# Patient Record
Sex: Male | Born: 1985
Health system: Southern US, Community
[De-identification: ages and names within clinical notes are randomized; demographics above are authoritative.]

## PROBLEM LIST (undated history)

## (undated) DIAGNOSIS — F32A Depression, unspecified: Secondary | ICD-10-CM

## (undated) DIAGNOSIS — I456 Pre-excitation syndrome: Secondary | ICD-10-CM

## (undated) DIAGNOSIS — F419 Anxiety disorder, unspecified: Secondary | ICD-10-CM

## (undated) DIAGNOSIS — F329 Major depressive disorder, single episode, unspecified: Secondary | ICD-10-CM

## (undated) HISTORY — DX: Anxiety disorder, unspecified: F41.9

## (undated) HISTORY — DX: Major depressive disorder, single episode, unspecified: F32.9

## (undated) HISTORY — DX: Depression, unspecified: F32.A

## (undated) HISTORY — DX: Pre-excitation syndrome: I45.6

---

## 2008-08-03 ENCOUNTER — Emergency Department (HOSPITAL_COMMUNITY): Admission: EM | Admit: 2008-08-03 | Discharge: 2008-08-03 | Payer: Self-pay | Admitting: Family Medicine

## 2011-11-08 ENCOUNTER — Ambulatory Visit: Payer: Self-pay | Admitting: Family Medicine

## 2011-11-08 VITALS — BP 130/88 | HR 88 | Temp 98.5°F | Resp 16 | Ht 76.5 in | Wt 212.4 lb

## 2011-11-08 DIAGNOSIS — F429 Obsessive-compulsive disorder, unspecified: Secondary | ICD-10-CM

## 2011-11-08 DIAGNOSIS — F341 Dysthymic disorder: Secondary | ICD-10-CM

## 2011-11-08 MED ORDER — FLUOXETINE HCL 40 MG PO CAPS: 40.0000 mg | ORAL_CAPSULE | Freq: Every day | ORAL | Status: DC

## 2011-11-08 NOTE — Progress Notes (Signed)
26 yo with OCD and depression several days a week. He is not depressed every day of every week.  No suicidal ideation. Did well on prozac 40 qd before O:  Alert, pleasant. A:  OCD, dysthymia  P:  Prozac 40 mg qd x 1 year

## 2011-11-08 NOTE — Patient Instructions (Signed)
Obsessive-Compulsive Disorder Obsessive-compulsive behavior is an anxiety disorder. The patient is constantly troubled by ideas that stay in the mind that cannot be ignored (obsessions). These troubling and sometimes bizarre thoughts compel one to behave in an unreasonable way. The patient will carry out repetitive, ritualistic acts (compulsions) to reduce anxiety. CAUSES The cause of obsessive-compulsive disorder (OCD) is not known. Scientists do know it runs in families. Men begin experiencing it in the teenage years. Women usually begin getting problems (symptoms) in their early 20's. Some studies have shown that the functioning of parts of the brain are different in those with OCD. The disorder may be closely associated with depression. SYMPTOMS  Persons with OCD recognize that their obsessions or compulsions prevent them from living a normal life. They commonly describe their behavior as foolish or pointless. But they cannot change it. Persons with OCD usually feel that their thoughts or obsessions are strange. They do not understand why they are having them. Sometimes the thoughts have to do with a fear that something terrible will happen or that they will do something terrible. Persons with OCD may spend hours each day performing senseless compulsive acts. The amount of time spent is less important than the degree of disruption caused in everyday life. EXAMPLES OF TYPICAL RITUALS  Cleaning. Fearing germs, a person may shower repeatedly throughout the entire day or wash his or her hands until they bleed.   Repeating. To dispel anxiety, a person may repeat a name or phrase many times.   Completing. A person may perform a series of complicated steps in an exact order or repeat them until they are done perfectly.   Checking. A person may check things over and over to make sure a task has been completed. For example, repeatedly checking to see if the door is locked or the toaster is unplugged.    Hoarding. A person may constantly collect useless items that he or she repeatedly counts and stacks.   People with OCD may have emotional symptoms associated with depression. This may include guilt, low self-esteem, anxiety, and extreme fatigue. Many of these emotional problems are a result of the frustration brought on by an obsessive-compulsive problem.   Obsessive-compulsive symptoms will often create problems in relationships.   In extreme cases, people with OCD become totally disabled, have no friends, and are unable to leave home surroundings. They spend the day performing rituals or having obsessive thoughts.  DIAGNOSIS  There is no laboratory test for OCD. It is diagnosed by your caregiver talking with you and someone close to you about your symptoms. Your caregiver will ask very specific questions about the type of obsessions or compulsions you have.   Your caregiver may diagnose obsessive-compulsive disorder if your obsessions or compulsions:   Cause you distress.   Take more than an hour of your time per day.   Interfere with your normal routine, occupation, social activities, or relationships with others.  YOUR CAREGIVER MAY ASK YOU SUCH QUESTIONS AS:  Do you have troubling thoughts that you cannot dispel regardless of how hard you try?   Do you keep things extremely clean and wash your hands more than other people you know?   Do you check things over and over, even though you know that the oven has been turned off or that the front door is locked?  TREATMENT  A combination of antidepressant or anti-anxiety drugs and behavior therapy has been most helpful in treating the disorder. Clomipramine (Anafranil) is often used in the   treatment of OCD. Some drugs like Zoloft and Luvox have been used with positive results. In very rare cases, neurosurgery is performed. OCD is not obsession about life's normal worries. Your caregiver will have to make sure that a medication or drug is  not adding to your symptoms. Phobias and longstanding (chronic) depression can also occur along with OCD.  HOW LONG WILL THE EFFECTS OF OBSESSIVE-COMPULSIVE DISORDER LAST? Obsessive-compulsive disorder usually appears in the late teens and early twenties. The disorder may last a lifetime without treatment. It may become less severe from time to time, but never quite goes away. You may become free of your symptoms for years before having a relapse. Developments in behavior therapy and new medications are helping many people with OCD live productive lives. HOME CARE INSTRUCTIONS   Include your family in your therapy. You and your family may benefit from reading books, studying OCD, and attending support groups.   Take your medication as ordered by your caregiver.   Do not miss your behavioral therapy sessions.   Know that you are not alone. There are millions of people affected by OCD. There are national organizations devoted to helping people with this disorder.  SEEK IMMEDIATE MEDICAL CARE IF:  You feel that any of your ideas or actions are slipping out of your control. Document Released: 08/02/2001 Document Revised: 07/28/2011 Document Reviewed: 04/15/2008 [facility] Patient Information 2012 [place], [place].

## 2012-12-20 ENCOUNTER — Other Ambulatory Visit: Payer: Self-pay | Admitting: Family Medicine

## 2013-01-31 ENCOUNTER — Ambulatory Visit: Payer: Self-pay | Admitting: Family Medicine

## 2013-03-08 ENCOUNTER — Ambulatory Visit: Payer: Self-pay | Admitting: Emergency Medicine

## 2013-03-08 VITALS — BP 102/68 | HR 52 | Temp 97.4°F | Resp 18 | Ht 76.0 in | Wt 215.0 lb

## 2013-03-08 DIAGNOSIS — F341 Dysthymic disorder: Secondary | ICD-10-CM

## 2013-03-08 DIAGNOSIS — F429 Obsessive-compulsive disorder, unspecified: Secondary | ICD-10-CM

## 2013-03-08 MED ORDER — FLUOXETINE HCL 40 MG PO CAPS: 40.0000 mg | ORAL_CAPSULE | Freq: Every day | ORAL | Status: DC

## 2013-03-08 NOTE — Patient Instructions (Addendum)
Obsessive-Compulsive Disorder Obsessive-compulsive behavior is an anxiety disorder. The patient is constantly troubled by ideas that stay in the mind that cannot be ignored (obsessions). These troubling and sometimes bizarre thoughts compel one to behave in an unreasonable way. The patient will carry out repetitive, ritualistic acts (compulsions) to reduce anxiety. CAUSES The cause of obsessive-compulsive disorder (OCD) is not known. Scientists do know it runs in families. Men begin experiencing it in the teenage years. Women usually begin getting problems (symptoms) in their early 20's. Some studies have shown that the functioning of parts of the brain are different in those with OCD. The disorder may be closely associated with depression. SYMPTOMS  Persons with OCD recognize that their obsessions or compulsions prevent them from living a normal life. They commonly describe their behavior as foolish or pointless. But they cannot change it. Persons with OCD usually feel that their thoughts or obsessions are strange. They do not understand why they are having them. Sometimes the thoughts have to do with a fear that something terrible will happen or that they will do something terrible. Persons with OCD may spend hours each day performing senseless compulsive acts. The amount of time spent is less important than the degree of disruption caused in everyday life. EXAMPLES OF TYPICAL RITUALS  Cleaning. Fearing germs, a person may shower repeatedly throughout the entire day or wash his or her hands until they bleed.  Repeating. To dispel anxiety, a person may repeat a name or phrase many times.  Completing. A person may perform a series of complicated steps in an exact order or repeat them until they are done perfectly.  Checking. A person may check things over and over to make sure a task has been completed. For example, repeatedly checking to see if the door is locked or the toaster is  unplugged.  Hoarding. A person may constantly collect useless items that he or she repeatedly counts and stacks.  People with OCD may have emotional symptoms associated with depression. This may include guilt, low self-esteem, anxiety, and extreme fatigue. Many of these emotional problems are a result of the frustration brought on by an obsessive-compulsive problem.  Obsessive-compulsive symptoms will often create problems in relationships.  In extreme cases, people with OCD become totally disabled, have no friends, and are unable to leave home surroundings. They spend the day performing rituals or having obsessive thoughts. DIAGNOSIS  There is no laboratory test for OCD. It is diagnosed by your caregiver talking with you and someone close to you about your symptoms. Your caregiver will ask very specific questions about the type of obsessions or compulsions you have.   Your caregiver may diagnose obsessive-compulsive disorder if your obsessions or compulsions:  Cause you distress.  Take more than an hour of your time per day.  Interfere with your normal routine, occupation, social activities, or relationships with others. YOUR CAREGIVER MAY ASK YOU SUCH QUESTIONS AS:  Do you have troubling thoughts that you cannot dispel regardless of how hard you try?  Do you keep things extremely clean and wash your hands more than other people you know?  Do you check things over and over, even though you know that the oven has been turned off or that the front door is locked? TREATMENT  A combination of antidepressant or anti-anxiety drugs and behavior therapy has been most helpful in treating the disorder. Clomipramine (Anafranil) is often used in the treatment of OCD. Some drugs like Zoloft and Luvox have been used with positive results.  In very rare cases, neurosurgery is performed. OCD is not obsession about life's normal worries. Your caregiver will have to make sure that a medication or drug is  not adding to your symptoms. Phobias and longstanding (chronic) depression can also occur along with OCD.  HOW LONG WILL THE EFFECTS OF OBSESSIVE-COMPULSIVE DISORDER LAST? Obsessive-compulsive disorder usually appears in the late teens and early twenties. The disorder may last a lifetime without treatment. It may become less severe from time to time, but never quite goes away. You may become free of your symptoms for years before having a relapse. Developments in behavior therapy and new medications are helping many people with OCD live productive lives. HOME CARE INSTRUCTIONS   Include your family in your therapy. You and your family may benefit from reading books, studying OCD, and attending support groups.  Take your medication as ordered by your caregiver.  Do not miss your behavioral therapy sessions.  Know that you are not alone. There are millions of people affected by OCD. There are national organizations devoted to helping people with this disorder. SEEK IMMEDIATE MEDICAL CARE IF:  You feel that any of your ideas or actions are slipping out of your control. Document Released: 08/02/2001 Document Revised: 10/31/2011 Document Reviewed: 04/15/2008 [facility] Patient Information 2014 [place], [place].

## 2013-03-08 NOTE — Progress Notes (Signed)
[facility] [address], [place] [address] [phone]  Date:  03/08/2013   Name:  Reginald Farley   DOB:  October 24, 1985   MRN:  [id number]  PCP:  Elvina Sidle, MD    Chief Complaint: rx refills   History of Present Illness:  Reginald Farley is a 27 y.o. very pleasant male patient who presents with the following:  History of OCD and depression.  Has been out of medication for at least three months.  Symptoms have intensified.  No depression.  Currently has a new job in [employer].  Finding the need to check things like the door lock 8 times before being satisfied that it is secure.  No suicidal/homicidal ideation or thoughts.  No improvement with over the counter medications or other home remedies. Denies other complaint or health concern today.   There are no active problems to display for this patient.   Past Medical History  Diagnosis Date  . Depression   . Anxiety     History reviewed. No pertinent past surgical history.  History  Substance Use Topics  . Smoking status: Never Smoker   . Smokeless tobacco: Not on file  . Alcohol Use: Not on file    Family History  Problem Relation Age of Onset  . Thyroid disease Mother   . Depression Mother   . Depression Father   . Depression Brother   . OCD Brother   . Alzheimer's disease Maternal Grandmother   . Depression Maternal Grandmother   . Heart disease Maternal Grandfather   . Alzheimer's disease Maternal Grandfather     Allergies  Allergen Reactions  . Sudafed (Pseudoephedrine Hcl) Other (See Comments)     Heart arrythmia    Medication list has been reviewed and updated.  Current Outpatient Prescriptions on File Prior to Visit  Medication Sig Dispense Refill  . FLUoxetine (PROZAC) 40 MG capsule TAKE ONE CAPSULE BY MOUTH EVERY DAY  30 capsule  0   No current facility-administered medications on file prior to visit.    Review of Systems:  As per HPI, otherwise  negative.    Physical Examination: Filed Vitals:   03/08/13 0916  BP: 102/68  Pulse: 52  Temp: 97.4 F (36.3 C)  Resp: 18   Filed Vitals:   03/08/13 0916  Height: 6\' 4"  (1.93 m)  Weight: 215 lb (97.523 kg)   Body mass index is 26.18 kg/(m^2). Ideal Body Weight: Weight in (lb) to have BMI = 25: 205   GEN: WDWN, NAD, Non-toxic, Alert & Oriented x 3 HEENT: Atraumatic, Normocephalic.  Ears and Nose: No external deformity. EXTR: No clubbing/cyanosis/edema NEURO: Normal gait.  PSYCH: Normally interactive. Conversant. Not depressed or anxious appearing.  Calm demeanor.    Assessment and Plan: OCD Refill zolft Follow up in 3 months    Signed,  Phillips Odor, MD

## 2013-08-07 ENCOUNTER — Telehealth: Payer: Self-pay

## 2013-08-07 NOTE — Telephone Encounter (Signed)
[name] STATES HER SON IS HAVING TROUBLE TAKING THE PILL FORM AND WOULD LIKE TO HAVE THE PROZAC IN LIQUID 40MG S, AND A 90 DAY SUPPLY PLEASE CALL [phone]   [facility] ON [address]

## 2013-08-07 NOTE — Telephone Encounter (Signed)
He was due for follow up in October, called her.  Is he completely out? Left message for call back.

## 2013-08-08 MED ORDER — FLUOXETINE HCL 20 MG/5ML PO SOLN: 40.0000 mg | Freq: Every day | ORAL | Status: DC

## 2013-08-08 NOTE — Telephone Encounter (Signed)
I am not sure if you can open prozac capsules.  Will switch him to liquid.  One month supply sent.  Needs f/u in January.  No further refills until OV

## 2013-08-08 NOTE — Telephone Encounter (Signed)
Patient advised of need for office visit , he can not come in until January because of lack of insurance. ( he asks for liquid, but this is capsule, can he open/ sprinkle? )

## 2014-04-22 ENCOUNTER — Ambulatory Visit (INDEPENDENT_AMBULATORY_CARE_PROVIDER_SITE_OTHER): Payer: No Typology Code available for payment source | Admitting: Family Medicine

## 2014-04-22 VITALS — BP 116/72 | HR 67 | Temp 98.0°F | Resp 16 | Ht 76.0 in | Wt 220.8 lb

## 2014-04-22 DIAGNOSIS — IMO0002 Reserved for concepts with insufficient information to code with codable children: Secondary | ICD-10-CM

## 2014-04-22 DIAGNOSIS — T148XXA Other injury of unspecified body region, initial encounter: Secondary | ICD-10-CM

## 2014-04-22 DIAGNOSIS — A084 Viral intestinal infection, unspecified: Secondary | ICD-10-CM

## 2014-04-22 DIAGNOSIS — A088 Other specified intestinal infections: Secondary | ICD-10-CM

## 2014-04-22 DIAGNOSIS — S0990XA Unspecified injury of head, initial encounter: Secondary | ICD-10-CM

## 2014-04-22 NOTE — Progress Notes (Signed)
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 He had no loss of consciousness but he did have a small abrasion on the left parietotemporal region. He continued working and felt fine and without dizziness, diplopia, hearing loss, or unstable gait for the rest of the day.  Beginning Friday night patient developed nausea and vomiting without diarrhea or fever. This lasted about 24 hours.  Patient is comes in today feeling relatively normal. He has mild headaches which he thinks may be from worrying. He just wants to make sure that he did not suffer any serious injury from the collision with the rebar.  Objective: Patient has a healed abrasion of the left temporoparietal region. He's alert, cooperative and articulate HEENT: Unremarkable with normal fundi, normal TMs, no battle sign, normal oropharynx with tongue midline and uvula raises midline. His hearing is normal. Neck is supple without adenopathy Chest is clear Heart is regular no murmur Extremity reflexes are intact and symmetrical Gait is stable  Assessment: Recent head injury, small abrasion on scalp, no evidence for ongoing concussive symptoms. I suspect that the nausea and vomiting on Friday night were related to a viral illness.  Plan: Patient reassured and told to come back if he should develop any diplopia, unstable gait, worsening headaches. , Elvina Sidle M.D.

## 2015-09-01 ENCOUNTER — Other Ambulatory Visit: Payer: Self-pay | Admitting: Nurse Practitioner

## 2015-09-01 ENCOUNTER — Ambulatory Visit
Admission: RE | Admit: 2015-09-01 | Discharge: 2015-09-01 | Disposition: A | Discharge status: Home / Self Care | Payer: BLUE CROSS/BLUE SHIELD | Source: Ambulatory Visit | Attending: Nurse Practitioner | Admitting: Nurse Practitioner

## 2015-09-01 DIAGNOSIS — Z77120 Contact with and (suspected) exposure to mold (toxic): Secondary | ICD-10-CM

## 2015-09-01 DIAGNOSIS — R9389 Abnormal findings on diagnostic imaging of other specified body structures: Secondary | ICD-10-CM

## 2015-09-01 MED ORDER — IOPAMIDOL (ISOVUE-300) INJECTION 61%
75.0000 mL | Freq: Once | INTRAVENOUS | Status: AC | PRN
  Administered 2015-09-01: 75 mL via INTRAVENOUS

## 2015-09-02 ENCOUNTER — Ambulatory Visit (INDEPENDENT_AMBULATORY_CARE_PROVIDER_SITE_OTHER): Payer: BLUE CROSS/BLUE SHIELD | Admitting: Family Medicine

## 2015-09-02 VITALS — BP 128/82 | HR 82 | Temp 98.0°F | Resp 16 | Ht 76.0 in | Wt 231.0 lb

## 2015-09-02 DIAGNOSIS — R9389 Abnormal findings on diagnostic imaging of other specified body structures: Secondary | ICD-10-CM

## 2015-09-02 DIAGNOSIS — R938 Abnormal findings on diagnostic imaging of other specified body structures: Secondary | ICD-10-CM

## 2015-09-02 NOTE — Patient Instructions (Signed)
CT scan completely negative

## 2015-09-02 NOTE — Progress Notes (Signed)
° °  Subjective:    Patient ID: Reginald Farley, male    DOB: 05/31/1986, 30 y.o.   MRN: [id number] By signing my name below, I, Javier Dockerobert Ryan Halas, attest that this documentation has been prepared under the direction and in the presence of Elvina SidleKurt Lauenstein, MD. Electronically Signed: Javier Dockerobert Ryan Halas, ER Scribe. 09/02/2015. 4:11 PM.  Chief Complaint  Patient presents with   have CT scan reviewed   Flu Vaccine    HPI HPI Comments: Reginald Kansas[place] R Kapaun is a [age] y.o. male who [facility] review after a potential exposure to black mold. He states he was in a crawl space with a lot of mold without any breathing equipment five days ago, and woke up the next morning with a mild sore throat and rhinorrhea. Those sx continued for three days and he then went to [facility] because he was concerned he may have had some sort of fungal infection. At [facility] he was given an strep test, and an x-ray followed by a CT scan. His strep test was negative, and his x-ray was questionable so a CT was ordered. The CT was normal. Filomena JunglingJerry Edwards ordered his CT scan. He states he has been going through hell for the last 24 hours because he was worried about his results. He states the doctor that read his CXR and did not give him any information, so he was very worried about his results. He was given a zpac at [facility] and has not taken it yet.    Past Medical History  Diagnosis Date   Depression    Anxiety    Current Outpatient Prescriptions on File Prior to Visit  Medication Sig Dispense Refill   FLUoxetine (PROZAC) 20 MG/5ML solution Take 10 mLs (40 mg total) by mouth daily. (Patient not taking: Reported on 09/02/2015) 300 mL 0   No current facility-administered medications on file prior to visit.    Review of Systems  Constitutional: Negative for fever and chills.  HENT: Positive for rhinorrhea and sore throat.   Psychiatric/Behavioral: Positive for sleep  disturbance. The patient is nervous/anxious.       Objective:  BP 128/82 mmHg   Pulse 82   Temp(Src) 98 F (36.7 C) (Oral)   Resp 16   Ht 6\' 4"  (1.93 m)   Wt 231 lb (104.781 kg)   BMI 28.13 kg/m2   SpO2 98%  Physical Exam  Constitutional: He is oriented to person, place, and time. He appears well-developed and well-nourished. No distress.  HENT:  Head: Normocephalic and atraumatic.  Eyes: Pupils are equal, round, and reactive to light.  Neck: Neck supple.  Cardiovascular: Normal rate.   Pulmonary/Chest: Effort normal. No respiratory distress.  Musculoskeletal: Normal range of motion.  Neurological: He is alert and oriented to person, place, and time. Coordination normal.  Skin: Skin is warm and dry. He is not diaphoretic.  Psychiatric: He has a normal mood and affect. His behavior is normal.  Nursing note and vitals reviewed.  Reassurance was given after 30 minutes of counseling due to fear after a potential mold exposure and an abnormal chest x-ray and a normal chest CT scan.     Assessment & Plan:    This chart was scribed in my presence and reviewed by me personally.    ICD-9-CM ICD-10-CM   1. Abnormal CXR 793.2 R93.8      Signed, Elvina SidleKurt Lauenstein, MD

## 2015-10-06 ENCOUNTER — Ambulatory Visit: Payer: BLUE CROSS/BLUE SHIELD | Admitting: Physician Assistant

## 2015-10-19 ENCOUNTER — Ambulatory Visit: Payer: No Typology Code available for payment source | Admitting: Family Medicine

## 2015-12-09 ENCOUNTER — Ambulatory Visit (INDEPENDENT_AMBULATORY_CARE_PROVIDER_SITE_OTHER): Payer: BLUE CROSS/BLUE SHIELD | Admitting: Family Medicine

## 2015-12-09 VITALS — BP 122/80 | HR 70 | Temp 97.9°F | Resp 16 | Ht 76.0 in | Wt 222.0 lb

## 2015-12-09 DIAGNOSIS — F429 Obsessive-compulsive disorder, unspecified: Secondary | ICD-10-CM | POA: Diagnosis not present

## 2015-12-09 LAB — COMPREHENSIVE METABOLIC PANEL
ALK PHOS: 60 U/L (ref 40–115)
ALT: 13 U/L (ref 9–46)
AST: 19 U/L (ref 10–40)
Albumin: 4.7 g/dL (ref 3.6–5.1)
BILIRUBIN TOTAL: 2.1 mg/dL — AB (ref 0.2–1.2)
BUN: 14 mg/dL (ref 7–25)
CALCIUM: 10 mg/dL (ref 8.6–10.3)
CO2: 27 mmol/L (ref 20–31)
CREATININE: 1 mg/dL (ref 0.60–1.35)
Chloride: 100 mmol/L (ref 98–110)
GLUCOSE: 84 mg/dL (ref 65–99)
Potassium: 4.5 mmol/L (ref 3.5–5.3)
Sodium: 138 mmol/L (ref 135–146)
Total Protein: 7.3 g/dL (ref 6.1–8.1)

## 2015-12-09 LAB — CBC WITH DIFFERENTIAL/PLATELET
BASOS PCT: 0 %
Basophils Absolute: 0 cells/uL (ref 0–200)
Eosinophils Absolute: 170 cells/uL (ref 15–500)
Eosinophils Relative: 2 %
HEMATOCRIT: 44.7 % (ref 38.5–50.0)
Hemoglobin: 15.1 g/dL (ref 13.2–17.1)
LYMPHS ABS: 1190 {cells}/uL (ref 850–3900)
LYMPHS PCT: 14 %
MCH: 28 pg (ref 27.0–33.0)
MCHC: 33.8 g/dL (ref 32.0–36.0)
MCV: 82.9 fL (ref 80.0–100.0)
MONO ABS: 935 {cells}/uL (ref 200–950)
MPV: 9.9 fL (ref 7.5–12.5)
Monocytes Relative: 11 %
Neutro Abs: 6205 cells/uL (ref 1500–7800)
Neutrophils Relative %: 73 %
Platelets: 332 10*3/uL (ref 140–400)
RBC: 5.39 MIL/uL (ref 4.20–5.80)
RDW: 13.2 % (ref 11.0–15.0)
WBC: 8.5 10*3/uL (ref 3.8–10.8)

## 2015-12-09 LAB — VITAMIN B12: Vitamin B-12: 425 pg/mL (ref 200–1100)

## 2015-12-09 LAB — TSH: TSH: 0.89 mIU/L (ref 0.40–4.50)

## 2015-12-09 MED ORDER — FLUOXETINE HCL 20 MG PO TABS: 40.0000 mg | ORAL_TABLET | Freq: Every day | ORAL | Status: DC

## 2015-12-09 MED ORDER — CLONAZEPAM 0.5 MG PO TABS: 0.2500 mg | ORAL_TABLET | Freq: Two times a day (BID) | ORAL | Status: DC | PRN

## 2015-12-09 NOTE — Patient Instructions (Addendum)
1.  Exercise 4-5 days per week for 30 minutes for stress management. 2.  We will contact you with a therapist to see. 3.  Return in 4-6 weeks for follow-up.  4. Continue Fluoxetine daily. 5.  Use Clonazepam 1 pill twice daily; (avoid alcohol when taking).     IF you received an x-ray today, you will receive an invoice from [facility]. Please contact [facility] at [phone] with questions or concerns regarding your invoice.   IF you received labwork today, you will receive an invoice from [facility]. Please contact [facility] at [phone] with questions or concerns regarding your invoice.   Our billing staff will not be able to assist you with questions regarding bills from these companies.  You will be contacted with the lab results as soon as they are available. The fastest way to get your results is to activate your My Chart account. Instructions are located on the last page of this paperwork. If you have not heard from us regarding the results in 2 weeks, please contact this office.     Obsessive-Compulsive Disorder Obsessive-compulsive disorder (OCD) is a mental illness. People with OCD have obsessions, compulsions, or both. Obsessions are unwanted and distressing thoughts, ideas, or urges that keep entering your mind. You may find yourself trying to ignore them. You may try to stop or undo them with a compulsion. Compulsions are repetitive physical or mental acts that you feel driven to perform. The actsmay seem senseless or excessive. They usually reduce or prevent any emotional distress. Obsessions and compulsions can be time consuming (take more than 1 hour a day). They can interfere with personal relationships and normal activities at home, school, or work.  OCD usually starts in young adulthood and continues throughout life. People who have family members with OCD are at higher risk for developing OCD. Women are at higher risk for OCD  during and after pregnancy. Many people with OCD also have depression or another mental health disorder. SYMPTOMS  People with obsessions usually have a fear that something terrible will happen or that they will do something terrible. Examples of common obsessions include:   Fear of contamination with germs, bodily waste, or poisonous substances.  Fear of making the wrong decision (self-doubt).  Violent or sexual thoughts or urges towards others.  Need for symmetry or exactness. Examples of typical compulsive acts include:   Excessive hand washing or bathing due to fear of contamination.  Checking things over and over to make sure a task has been completed. For example, making sure a door is locked or a toaster is unplugged.  Repeating an act or phrase over and over, sometimes a specific number of times, until it feels right.  Arranging and rearranging objects to keep them in a certain order. DIAGNOSIS  OCD is diagnosed through an assessment by your health care provider. Your health care provider will ask questions about any obsessions or compulsions you have and how they affect your life. Your health care provider may also ask about your medical history, prescription medicine, and drug use. Certain medical conditions and substances can cause symptoms similar to OCD. TREATMENT  The following forms of treatment are available for OCD:  Cognitive therapy. This is a form of talk therapy. The goal is to identify and change the irrational thoughts associated with obsessions.  Behavioral therapy. This is also a form of talk therapy. The goal is to target and reduce compulsive acts (behaviors). A specific type of behavioral therapy  called exposure and response prevention is considered the most effective treatment for OCD. You will be exposed to the distressing situation that triggers your compulsion and prevented from responding to it. With repetition of this process over time, you will no longer  feel the distress or need to perform the compulsion.   Medicine. Certain types of antidepressant medicine may help reduce or control OCD symptoms. Medication is most effective when it is used with cognitive or behavioral therapy. For severe OCD that does not respond to talk therapy and medication, brain surgery or electrical stimulation of specific areas of the brain (deep brain stimulation) may be helpful. HOME CARE INSTRUCTIONS   Take all medicines as prescribed.  Check with your health care provider before starting new prescription or over-the-counter medicines. SEEK MEDICAL CARE IF:  If you are not able to take your medicines as prescribed.  If your symptoms get worse. SEEK IMMEDIATE MEDICAL CARE IF:  You feel that any of your ideas or actions are slipping out of your control.   This information is not intended to replace advice given to you by your health care provider. Make sure you discuss any questions you have with your health care provider.   Document Released: 08/02/2001 Document Revised: 08/13/2013 Document Reviewed: 03/22/2013 [employer] Interactive Patient Education [employer]

## 2015-12-09 NOTE — Progress Notes (Signed)
Subjective:    Patient ID: Reginald Farley, male    DOB: 1985-11-25, 30 y.o.   MRN: [id number]  12/09/2015  Follow-up   HPI This 30 y.o. male presents for evaluation of generalized anxiety disorder, OCD.  Taking Fluoxetine  daily.  Took Fluoxetine years ago at age 52-23 yo.  Then no medication until a couple of years ago; restarted Fluoxetine 3 weeks ago.  Took for one week with improvement and then missed severall days of medication.  Jogs and [employer].  One day per week.  Limits caffeine intake.  Drinks sodas, sweet tea, coffee.    Alcohol daily; drinking 1-6 beers per day.  Review of Systems  Constitutional: Negative for fever, chills, diaphoresis, activity change, appetite change and fatigue.  Respiratory: Negative for cough and shortness of breath.   Cardiovascular: Negative for chest pain, palpitations and leg swelling.  Gastrointestinal: Negative for nausea, vomiting, abdominal pain and diarrhea.  Endocrine: Negative for cold intolerance, heat intolerance, polydipsia, polyphagia and polyuria.  Skin: Negative for color change, rash and wound.  Neurological: Negative for dizziness, tremors, seizures, syncope, facial asymmetry, speech difficulty, weakness, light-headedness, numbness and headaches.  Psychiatric/Behavioral: Negative for suicidal ideas, behavioral problems, confusion, sleep disturbance, self-injury, dysphoric mood and agitation. The patient is nervous/anxious.     Past Medical History  Diagnosis Date  . Depression   . Anxiety   . Wolff-Parkinson-White (WPW) syndrome    History reviewed. No pertinent past surgical history. Allergies  Allergen Reactions  . Sudafed [Pseudoephedrine Hcl] Other (See Comments)     Heart arrythmia    Social History   Social History  . Marital Status: Married    Spouse Name: N/A  . Number of Children: N/A  . Years of Education: N/A   Occupational History  . Not on file.   Social History Main Topics  . Smoking  status: Never Smoker   . Smokeless tobacco: Never Used  . Alcohol Use: 0.0 oz/week    0 Standard drinks or equivalent per week  . Drug Use: No  . Sexual Activity: Not on file   Other Topics Concern  . Not on file   Social History Narrative   Family History  Problem Relation Age of Onset  . Thyroid disease Mother   . Depression Mother   . Depression Father   . Depression Brother   . OCD Brother   . Alzheimer's disease Maternal Grandmother   . Depression Maternal Grandmother   . Heart disease Maternal Grandfather   . Alzheimer's disease Maternal Grandfather        Objective:    BP 122/80 mmHg  Pulse 70  Temp(Src) 97.9 F (36.6 C) (Oral)  Resp 16  Ht  (1.93 m)  Wt 222 lb (100.699 kg)  BMI 27.03 kg/m2  SpO2 98% Physical Exam  Constitutional: He is oriented to person, place, and time. He appears well-developed and well-nourished. No distress.  HENT:  Head: Normocephalic and atraumatic.  Right Ear: External ear normal.  Left Ear: External ear normal.  Nose: Nose normal.  Mouth/Throat: Oropharynx is clear and moist.  Eyes: Conjunctivae and EOM are normal. Pupils are equal, round, and reactive to light.  Neck: Normal range of motion. Neck supple. Carotid bruit is not present. No thyromegaly present.  Cardiovascular: Normal rate, regular rhythm, normal heart sounds and intact distal pulses.  Exam reveals no gallop and no friction rub.   No murmur heard. Pulmonary/Chest: Effort normal and breath sounds normal. He has no wheezes.  He has no rales.  Abdominal: Soft. Bowel sounds are normal. He exhibits no distension and no mass. There is no tenderness. There is no rebound and no guarding.  Lymphadenopathy:    He has no cervical adenopathy.  Neurological: He is alert and oriented to person, place, and time. No cranial nerve deficit.  Skin: Skin is warm and dry. No rash noted. He is not diaphoretic.  Psychiatric: He has a normal mood and affect. His behavior is normal.    Nursing note and vitals reviewed.  Results for orders placed or performed during the hospital encounter of 08/03/08  POCT rapid strep A (device)  Result Value Ref Range   Streptococcus, Group A Screen (Direct) NEGATIVE NEGATIVE       Assessment & Plan:   1. OCD (obsessive compulsive disorder)    -New/recurrent/uncontrolled. -Obtain labs. -Rx for Fluoxetine 40mg  daily provided.  rx for Clonazepam also provided to take for the next month. -avoid alcohol while taking clonazepam. -exercise daily for stress management. -refer for psychotherapy.   Orders Placed This Encounter  Procedures  . CBC with Differential/Platelet  . Comprehensive metabolic panel  . TSH  . Vitamin B12  . VITAMIN D 25 Hydroxy (Vit-D Deficiency, Fractures)  . Ambulatory referral to Psychology    Referral Priority:  Routine    Referral Type:  Psychiatric    Referral Reason:  Specialty Services Required    Requested Specialty:  Psychology    Number of Visits Requested:  1   Meds ordered this encounter  Medications  . FLUoxetine (PROZAC) 20 MG tablet    Sig: Take 2 tablets (40 mg total) by mouth daily.    Dispense:  60 tablet    Refill:  5  . clonazePAM (KLONOPIN) 0.5 MG tablet    Sig: Take 0.5-1 tablets (0.25-0.5 mg total) by mouth 2 (two) times daily as needed for anxiety.    Dispense:  30 tablet    Refill:  0    No Follow-up on file.    Kadir Azucena Paulita FujitaMartin Jake Goodson, M.D. [facility]  [facility] [place] [address] [place], [place]  [id number] [phone] phone [phone] fax

## 2015-12-10 LAB — VITAMIN D 25 HYDROXY (VIT D DEFICIENCY, FRACTURES): VIT D 25 HYDROXY: 42 ng/mL (ref 30–100)

## 2015-12-25 ENCOUNTER — Encounter: Payer: Self-pay | Admitting: Family Medicine

## 2015-12-30 ENCOUNTER — Telehealth: Payer: Self-pay

## 2015-12-30 NOTE — Telephone Encounter (Signed)
Who do you recommend for therapy?

## 2015-12-30 NOTE — Telephone Encounter (Signed)
Pt is needing to talk with dr Katrinka Blazingsmith about referring him to a therapist and he states he gets bad cell service at his house so it is ok to leave a message  Best number [phone]

## 2016-01-04 ENCOUNTER — Telehealth: Payer: Self-pay

## 2016-01-04 NOTE — Telephone Encounter (Signed)
Referrals, please provide patient with a list of therapist in the area.

## 2016-01-04 NOTE — Telephone Encounter (Signed)
Msg is for Dr. Katrinka BlazingSmith, pt was checking on the status of his referral for counseling/therapist. Write a recommendation for him.  Please advise   [phone]

## 2016-01-05 NOTE — Telephone Encounter (Signed)
Called patient and gave him the contact information for [facility].  Patient states he will call and schedule an appointment.

## 2016-01-17 NOTE — Telephone Encounter (Signed)
Referrals ---- please call patient with a list of therapist who we usually refer to for OCD and generalized anxiety disorder.  Thanks.

## 2016-02-01 NOTE — Telephone Encounter (Signed)
Spoke to patient - he established with a psychologist's office about a month ago.

## 2016-04-14 ENCOUNTER — Telehealth: Payer: Self-pay

## 2016-04-14 NOTE — Telephone Encounter (Signed)
Pt would like a refill on his FLUoxetine (PROZAC) 20 MG tablet [[id number]][[id number]]. [facility]. He would like us to send it to [id number] - [place], [place] - [address] Please advise at [phone]

## 2016-04-16 NOTE — Telephone Encounter (Signed)
Patient requesting Prozac Rx refill. Your note from April indicated for him to follow up with you in 4-6 weeks, he did not , but notes do indicate he has seen Psych. Ok to refill Prozac? What should I advise him regarding follow up plan ?

## 2016-04-22 MED ORDER — FLUOXETINE HCL 20 MG PO TABS: 40.0000 mg | ORAL_TABLET | Freq: Every day | ORAL | 0 refills | Status: DC

## 2016-04-22 NOTE — Telephone Encounter (Signed)
Patient needs follow-up with me.  I did instruct him to be seen in 4-6 weeks after last visit in April; it is now four months later. If he is seeing psychiatry, then he/she should be refilling medication.  I will refill medication for one additional month to give him time to come in for follow-up.

## 2016-04-26 NOTE — Telephone Encounter (Signed)
Advised pt that Dr Katrinka BlazingSmith sent in 1 mos RF to cover him until he can get in for f/up. Pt reported that he has been seeing Geoffry ParadiseKen Frasier, who told him that he can not Rx medication because he is a therapist not an MD. He stated that the visits with Rocky LinkKen have made a big difference in his day to day life. I advised pt to RTC to discuss with Dr Katrinka BlazingSmith whether she thinks pt also needs a psychiatrist to manage meds, or just continue seeing Rocky LinkKen for therapy. Pt agreed and I explained same day appts.

## 2016-05-14 ENCOUNTER — Ambulatory Visit: Payer: BLUE CROSS/BLUE SHIELD

## 2016-11-24 ENCOUNTER — Other Ambulatory Visit: Payer: Self-pay | Admitting: Family Medicine

## 2017-02-03 ENCOUNTER — Other Ambulatory Visit: Payer: Self-pay | Admitting: Family Medicine

## 2017-02-11 ENCOUNTER — Other Ambulatory Visit: Payer: Self-pay | Admitting: Family Medicine

## 2017-02-12 NOTE — Telephone Encounter (Signed)
Call --- I denied refill of Fluoxetine because it has been 14 months since I have seen patient; please offer him an appointment in the upcoming 1-2 weeks with me for refills.

## 2017-02-14 ENCOUNTER — Ambulatory Visit: Payer: BLUE CROSS/BLUE SHIELD | Admitting: Physician Assistant

## 2017-02-15 ENCOUNTER — Encounter: Payer: Self-pay | Admitting: Family Medicine

## 2017-02-15 ENCOUNTER — Ambulatory Visit (INDEPENDENT_AMBULATORY_CARE_PROVIDER_SITE_OTHER): Payer: BLUE CROSS/BLUE SHIELD | Admitting: Family Medicine

## 2017-02-15 VITALS — BP 119/76 | HR 77 | Temp 97.0°F | Resp 18 | Ht 75.98 in | Wt 245.0 lb

## 2017-02-15 DIAGNOSIS — Z23 Encounter for immunization: Secondary | ICD-10-CM

## 2017-02-15 DIAGNOSIS — F422 Mixed obsessional thoughts and acts: Secondary | ICD-10-CM

## 2017-02-15 MED ORDER — CLONAZEPAM 0.5 MG PO TABS: 0.2500 mg | ORAL_TABLET | Freq: Two times a day (BID) | ORAL | 0 refills | Status: DC | PRN

## 2017-02-15 MED ORDER — FLUOXETINE HCL 20 MG PO TABS: 80.0000 mg | ORAL_TABLET | Freq: Every day | ORAL | 1 refills | Status: DC

## 2017-02-15 NOTE — Patient Instructions (Addendum)
IF you received an x-ray today, you will receive an invoice from [facility]. Please contact [facility] at [phone] with questions or concerns regarding your invoice.   IF you received labwork today, you will receive an invoice from [place]. Please contact [facility] at [phone] with questions or concerns regarding your invoice.   Our billing staff will not be able to assist you with questions regarding bills from these companies.  You will be contacted with the lab results as soon as they are available. The fastest way to get your results is to activate your My Chart account. Instructions are located on the last page of this paperwork. If you have not heard from [place] regarding the results in 2 weeks, please contact this office.      Obsessive-Compulsive Disorder Obsessive-compulsive disorder (OCD) is a brain-based anxiety disorder. People with OCD have obsessions, compulsions, or both. Obsessions are unwanted and distressing thoughts, ideas, or urges that keep entering your mind and result in anxiety. You may find yourself trying to ignore them. You may try to stop or undo them with a compulsion. Compulsions are repetitive physical or mental acts that you feel you have to do. They may reduce or prevent any emotional distress, but in most instances, they are ineffective. Compulsions can be very time-consuming, often taking more than one hour each day. They can interfere with personal relationships and normal activities at home, school, or work. OCD can begin in childhood, but it usually starts in young adulthood and continues throughout life. Many people with OCD also have depression or another mental health disorder. What are the causes? The cause of this condition is not known. What increases the risk? This condition is more like to develop in:  People who have experienced trauma.  People who have a family history of OCD.  Women during and after  pregnancy.  People who have infections and post-infectious autoimmune syndrome.  People who have other mental health conditions.  People who abuse substances.  What are the signs or symptoms? Symptoms of OCD include compulsions and obsessions. People with obsessions usually have a fear that something terrible will happen or that they will do something terrible. Examples of common obsessions include:  Fear of contamination with germs, waste, or poisonous substances.  Fear of making the wrong decision.  Violent or sexual thoughts or urges towards others.  Need for symmetry or exactness.  Examples of common compulsions include:  Excessive handwashing or bathing due to fear of contamination.  Checking things over and over to make sure you finished a task, such as making sure you locked a door or unplugged a toaster.  Repeating an act or phrase over and over, sometimes a specific number of times, until it feels right.  Arranging and rearranging objects to keep them in a certain order.  Having a very hard time making a decision and sticking to it.  How is this diagnosed? OCD is diagnosed through an assessment by your health care provider. Your health care provider will ask questions about any obsessions or compulsions you have and how they affect your life. Your health care provider may also ask about your medical history, prescription medicines, and drug use. Certain medical conditions and substances can cause symptoms that are similar to OCD. Your health care provider may also refer you to a mental health specialist. How is this treated? Treatment may include:  Cognitive therapy. This is a form of talk therapy. The goal is to identify and change  the irrational thoughts associated with obsessions.  Behavioral therapy. A type of behavioral therapy called exposure and response prevention is often used. In this therapy, you will be exposed to the distressing situation that triggers  your compulsion and be prevented from responding to it. With repetition of this process over time, you will no longer feel the distress or need to perform the compulsion.  Self-soothing. Meditation, deep breathing, or yoga can help you manage the physiological symptoms of anxiety and can help with how you think.  Medicine. Certain types of antidepressant medicine may help reduce or control OCD symptoms. Medicine is most effective when used with cognitive or behavioral therapy.  Treatment usually involves a combination of therapy and medicines. For severe OCD that does not respond to talk therapy and medicine, brain surgery or electrical stimulation of specific areas of the brain (deep brain stimulation) may be considered. Follow these instructions at home:  Take over-the-counter and prescription medicines only as told by your health care provider. Do not start taking any new medicines with approval from your health care provider.  Consider joining a support group for people with OCD.  Keep all follow-up visits as told by your health care provider. This is important. Contact a health care provider if:  You are not able to take your medicines as prescribed.  Your symptoms get worse. Get help right away if:  You have suicidal thoughts or thoughts about hurting yourself or others. If you ever feel like you may hurt yourself or others, or have thoughts about taking your own life, get help right away. You can go to your nearest emergency department or call:  Your local emergency services (911 in the [place]  A suicide crisis helpline, such as the National Suicide Prevention Lifeline at 67814244691-830-657-9798. This is open 24-hours a day.  Summary  Obsessive-compulsive disorder (OCD) is a brain-based anxiety disorder. People with OCD have obsessions, compulsions, or both.  OCD can interfere with personal relationships and normal activities at home, school, or work.  Treatment usually involves a  combination of therapy and medicines. This information is not intended to replace advice given to you by your health care provider. Make sure you discuss any questions you have with your health care provider. Document Released: 08/02/2001 Document Revised: 05/23/2016 Document Reviewed: 05/23/2016 Elsevier Interactive Patient Education  2018 [employer]

## 2017-02-15 NOTE — Progress Notes (Signed)
Subjective:    Patient ID: Reginald Farley, male    DOB: [birth year], 31 y.o.   MRN: [id number]  02/15/2017  Medication Refill (Flouoxetine) and Anxiety   HPI This 31 y.o. male presents for  Fourteen month follow-up evaluation of OCD and irritability. Started seeing Jackelyn Hoehn for OCD; no longer seeing him; last visit 4 months ago. Irritability and OCD.  Wife notices a night and day difference on 20m Prozac.  Irritability gets out of hand; with OCD and worries.  Irritability is from excessive worry; takes out on wife.  Loses temper.   Rarely takes Clonazepam.    Work is fine; work is hard and good and busy.  No side effects to Fluoxetine.  Sex drive. No delayed orgasm. Coffee in the morning.   Owns own business; deals with homeowners.  Drinking 5 drinks per night.   Review of Systems  Constitutional: Negative for activity change, appetite change, chills, diaphoresis, fatigue and fever.  Eyes: Negative for visual disturbance.  Respiratory: Negative for cough and shortness of breath.   Cardiovascular: Negative for chest pain, palpitations and leg swelling.  Endocrine: Negative for cold intolerance, heat intolerance, polydipsia, polyphagia and polyuria.  Neurological: Negative for dizziness, tremors, seizures, syncope, facial asymmetry, speech difficulty, weakness, light-headedness, numbness and headaches.  Psychiatric/Behavioral: Positive for agitation. Negative for dysphoric mood, self-injury, sleep disturbance and suicidal ideas. The patient is nervous/anxious.     Past Medical History:  Diagnosis Date  . Anxiety   . Depression   . Wolff-Parkinson-White (WPW) syndrome    History reviewed. No pertinent surgical history. Allergies  Allergen Reactions  . Sudafed [Pseudoephedrine Hcl] Other (See Comments)     Heart arrythmia    Social History   Social History  . Marital status: Married    Spouse name: N/A  . Number of children: N/A  . Years of education: N/A    Occupational History  . Not on file.   Social History Main Topics  . Smoking status: Never Smoker  . Smokeless tobacco: Never Used  . Alcohol use 0.0 oz/week  . Drug use: No  . Sexual activity: Not on file   Other Topics Concern  . Not on file   Social History Narrative  . No narrative on file   Family History  Problem Relation Age of Onset  . Thyroid disease Mother   . Depression Mother   . Depression Father   . Depression Brother   . OCD Brother   . Alzheimer's disease Maternal Grandmother   . Depression Maternal Grandmother   . Heart disease Maternal Grandfather   . Alzheimer's disease Maternal Grandfather        Objective:    BP 119/76   Pulse 77   Temp 97 F (36.1 C) (Oral)   Resp 18   Ht 6' 3.98" (1.93 m)   Wt 245 lb (111.1 kg)   SpO2 97%   BMI 29.83 kg/m  Physical Exam Results for orders placed or performed in visit on 12/09/15  CBC with Differential/Platelet  Result Value Ref Range   WBC 8.5 3.8 - 10.8 K/uL   RBC 5.39 4.20 - 5.80 MIL/uL   Hemoglobin 15.1 13.2 - 17.1 g/dL   HCT 44.7 38.5 - 50.0 %   MCV 82.9 80.0 - 100.0 fL   MCH 28.0 27.0 - 33.0 pg   MCHC 33.8 32.0 - 36.0 g/dL   RDW 13.2 11.0 - 15.0 %   Platelets 332 140 - 400 K/uL   MPV  9.9 7.5 - 12.5 fL   Neutro Abs 6,205 1,500 - 7,800 cells/uL   Lymphs Abs 1,190 850 - 3,900 cells/uL   Monocytes Absolute 935 200 - 950 cells/uL   Eosinophils Absolute 170 15 - 500 cells/uL   Basophils Absolute 0 0 - 200 cells/uL   Neutrophils Relative % 73 %   Lymphocytes Relative 14 %   Monocytes Relative 11 %   Eosinophils Relative 2 %   Basophils Relative 0 %   Smear Review Criteria for review not met   Comprehensive metabolic panel  Result Value Ref Range   Sodium 138 135 - 146 mmol/L   Potassium 4.5 3.5 - 5.3 mmol/L   Chloride 100 98 - 110 mmol/L   CO2 27 20 - 31 mmol/L   Glucose, Bld 84 65 - 99 mg/dL   BUN 14 7 - 25 mg/dL   Creat 1.00 0.60 - 1.35 mg/dL   Total Bilirubin 2.1 (H) 0.2 - 1.2  mg/dL   Alkaline Phosphatase 60 40 - 115 U/L   AST 19 10 - 40 U/L   ALT 13 9 - 46 U/L   Total Protein 7.3 6.1 - 8.1 g/dL   Albumin 4.7 3.6 - 5.1 g/dL   Calcium 10.0 8.6 - 10.3 mg/dL  TSH  Result Value Ref Range   TSH 0.89 0.40 - 4.50 mIU/L  Vitamin B12  Result Value Ref Range   Vitamin B-12 425 200 - 1,100 pg/mL  VITAMIN D 25 Hydroxy (Vit-D Deficiency, Fractures)  Result Value Ref Range   Vit D, 25-Hydroxy 42 30 - 100 ng/mL       Assessment & Plan:   1. Mixed obsessional thoughts and acts   2. Need for Tdap vaccination    -improved yet persistent; increase Prozac to 8m daily; refill of Klonopin provided yet taking very sparingly. -recommend exercise for stress management.  -recommend decreasing alcohol intake.  -prolonged face-to-face for 30 minutes with greater than 50% of time dedicated to counseling and coordination of care.   Orders Placed This Encounter  Procedures  . Tdap vaccine greater than or equal to 7yo IM   Meds ordered this encounter  Medications  . FLUoxetine (PROZAC) 20 MG tablet    Sig: Take 4 tablets (80 mg total) by mouth daily.    Dispense:  360 tablet    Refill:  1  . clonazePAM (KLONOPIN) 0.5 MG tablet    Sig: Take 0.5-1 tablets (0.25-0.5 mg total) by mouth 2 (two) times daily as needed for anxiety.    Dispense:  30 tablet    Refill:  0    Return in about 3 months (around 05/18/2017) for recheck OCD.   Denora Wysocki MElayne Guerin M.D. [facility]  [phone] [phone]

## 2017-02-19 ENCOUNTER — Encounter: Payer: Self-pay | Admitting: Family Medicine

## 2017-02-19 DIAGNOSIS — F422 Mixed obsessional thoughts and acts: Secondary | ICD-10-CM | POA: Insufficient documentation

## 2017-05-16 ENCOUNTER — Ambulatory Visit: Payer: BLUE CROSS/BLUE SHIELD | Admitting: Family Medicine

## 2017-06-22 IMAGING — CT CT CHEST W/ CM
2 of 3 series · 15 of 30 positions shown, 17 images · IV contrast (75CC ISOVUE 300)
Comparison: None.

CLINICAL DATA: Cough and sore throat since 08/28/2015, when patient
had mold exposure.

EXAM:
CT CHEST WITH CONTRAST
TECHNIQUE: Multidetector CT imaging of the chest was performed during
intravenous contrast administration.
CONTRAST:  75mL [id number] IOPAMIDOL ([id number]) INJECTION 61%

[Series 3: chest with · axial · 0.76mm/px · z∈[-284,-29]mm · 7 of 69 slices shown, 9 images]
[im 9/69  mediastinal]
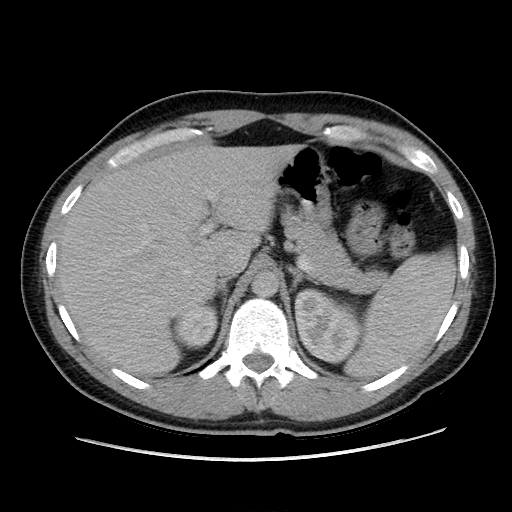
[im 9/69  lung]
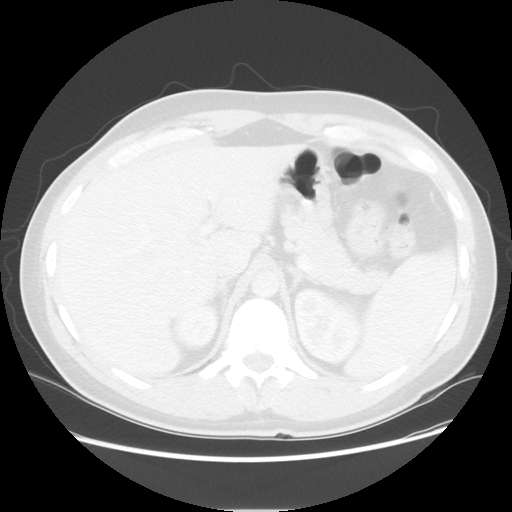
[im 18/69  lung]
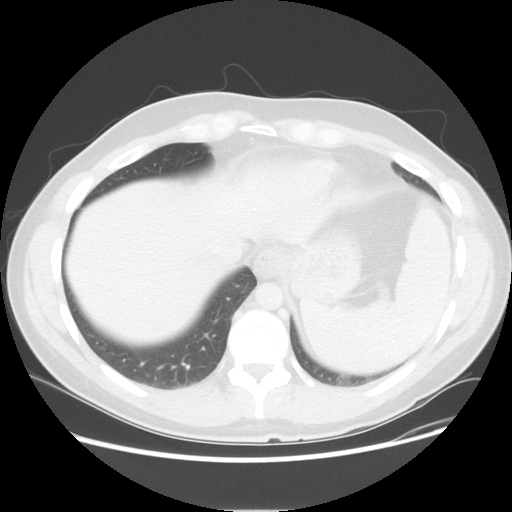
[im 26/69  lung]
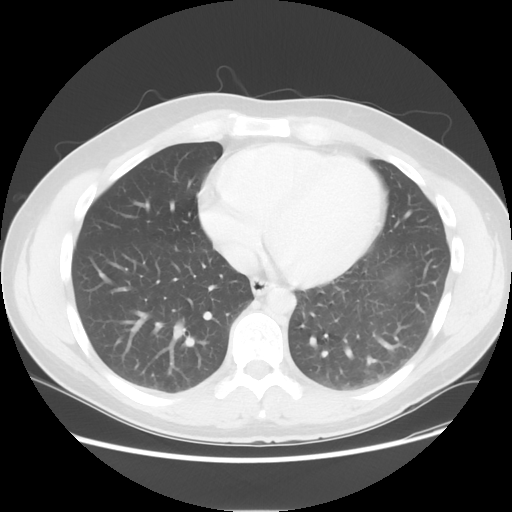
[im 35/69  lung]
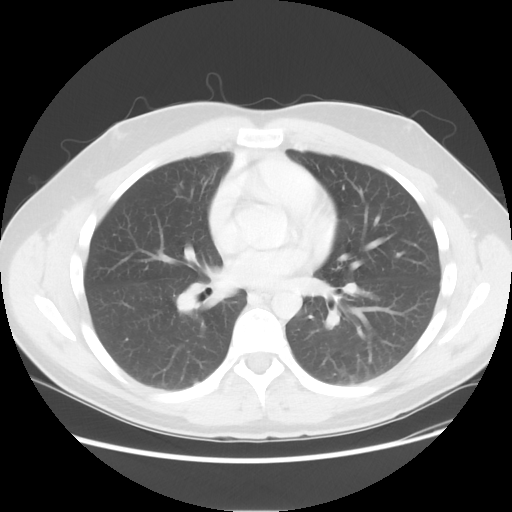
[im 43/69  mediastinal]
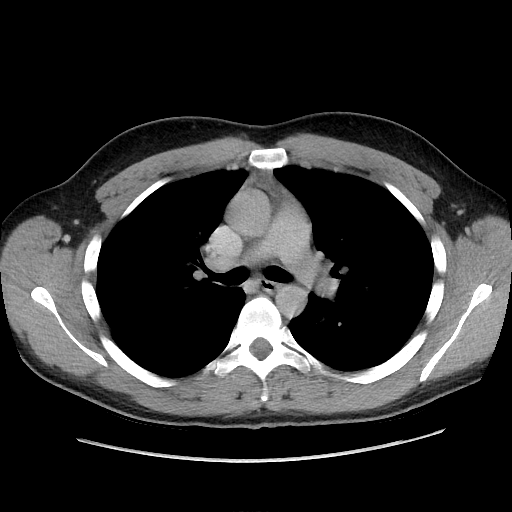
[im 43/69  lung]
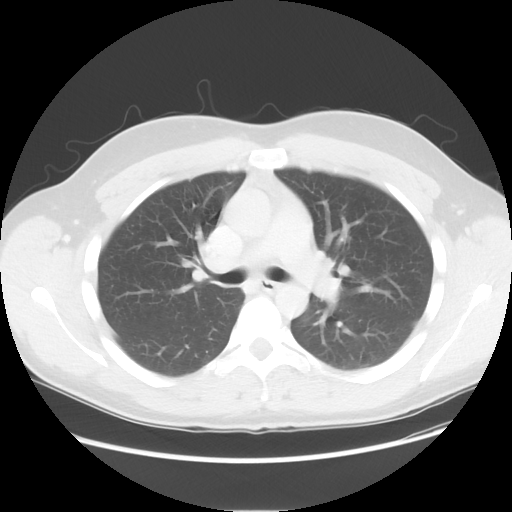
[im 52/69  lung]
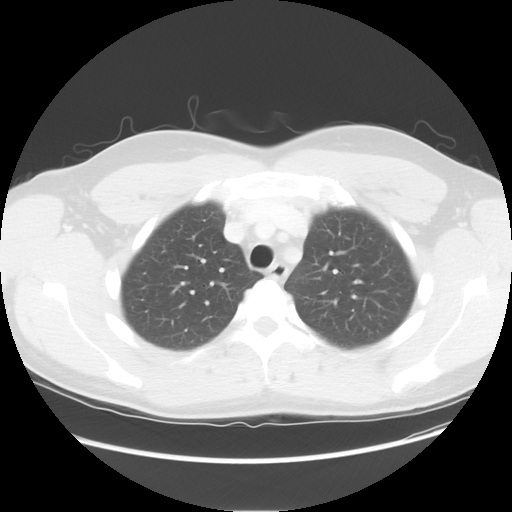
[im 60/69  lung]
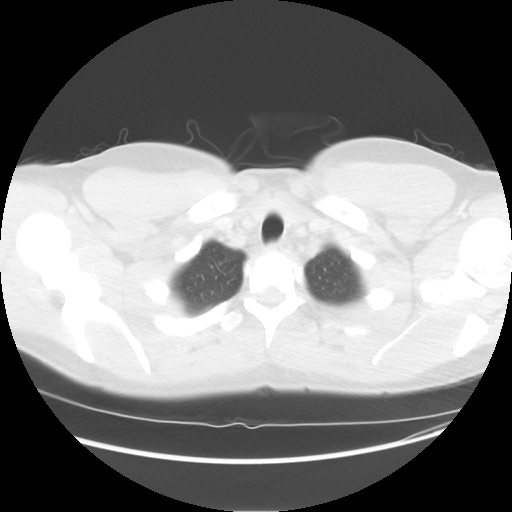

[Series 602: sagittal body · sagittal · 0.76mm/px · 8 of 157 slices shown]
[im 18/157  mediastinal]
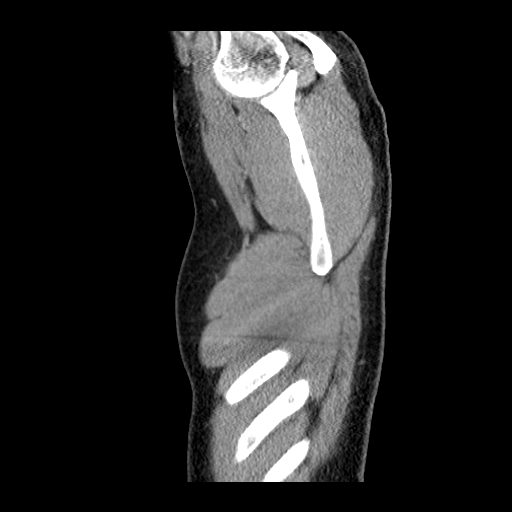
[im 35/157  mediastinal]
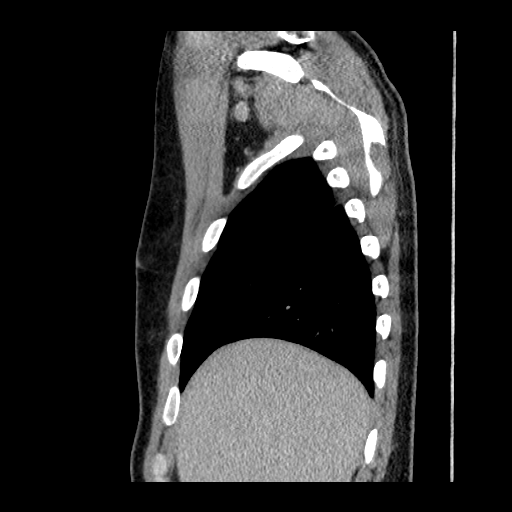
[im 53/157  mediastinal]
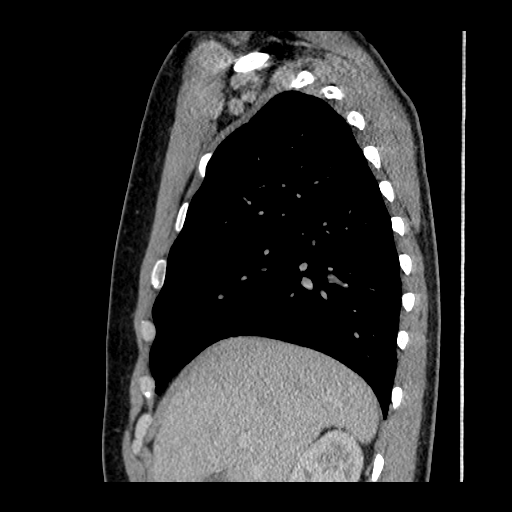
[im 70/157  mediastinal]
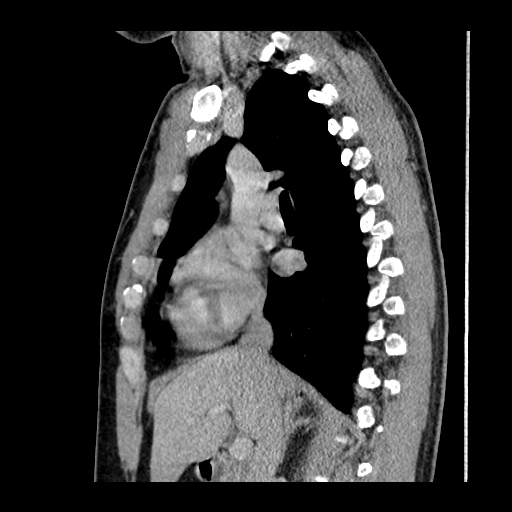
[im 87/157  mediastinal]
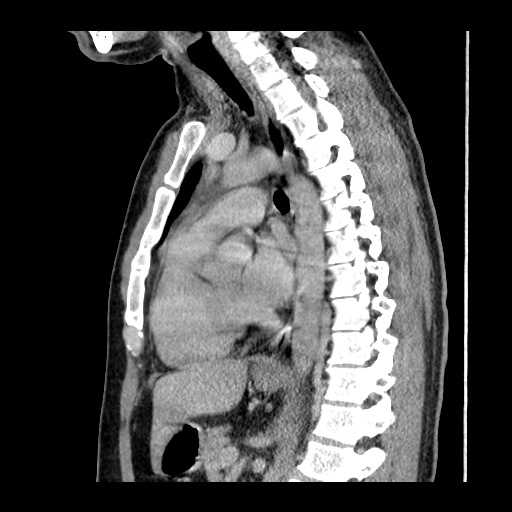
[im 105/157  mediastinal]
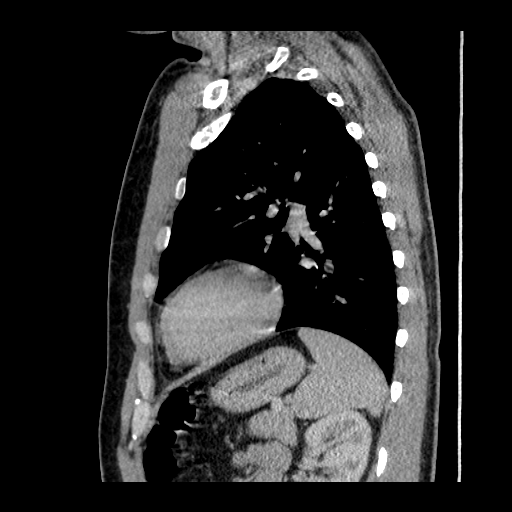
[im 122/157  mediastinal]
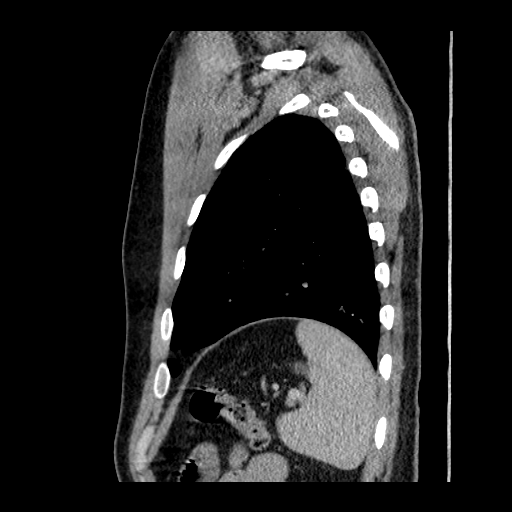
[im 139/157  mediastinal]
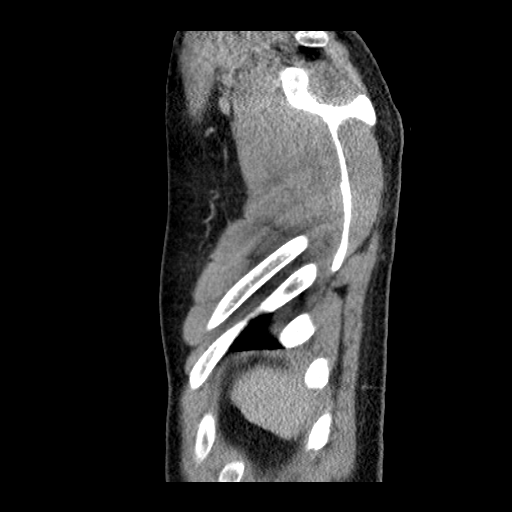

[15 of 30 positions shown; findings below may reference images not displayed]

FINDINGS: Mediastinum/Lymph Nodes: No masses, pathologically enlarged lymph
nodes, or other significant abnormality.

Lungs/Pleura: No pulmonary mass, infiltrate, or effusion. Mild
dependent bibasilar hypoventilatory changes are seen.

Upper abdomen: No acute findings.

Musculoskeletal: No chest wall mass or suspicious bone lesions
identified.
IMPRESSION: Normal appearance of the thorax.

## 2017-08-07 ENCOUNTER — Telehealth: Payer: Self-pay

## 2017-08-07 NOTE — Telephone Encounter (Signed)
Copied from CRM [phone]. Topic: Referral - Request >> Aug 07, 2017 11:56 AM Windy KalataMichael, Taylor L, NT wrote: Reason for CRM: patient would like to be referred to a cardiologist. He has seen a cardiologist 5 years ago - he was diagnosed with wolff parkinson white syndrome and it has been flaring up and he would like to be referred to another cardiologist for a check up. If patient needs to be seen please contact pt and let them know.

## 2017-08-08 NOTE — Telephone Encounter (Signed)
Please call -- patient needs OV to discuss further and for referral.

## 2017-08-16 ENCOUNTER — Ambulatory Visit: Payer: BLUE CROSS/BLUE SHIELD | Admitting: Family Medicine

## 2017-08-20 ENCOUNTER — Other Ambulatory Visit: Payer: Self-pay | Admitting: Family Medicine

## 2017-08-20 DIAGNOSIS — F422 Mixed obsessional thoughts and acts: Secondary | ICD-10-CM

## 2017-08-21 NOTE — Telephone Encounter (Signed)
Does Dr. Katrinka BlazingSmith want to see pt prior to refill?   Been 6 months since last OV

## 2017-12-12 ENCOUNTER — Ambulatory Visit: Payer: BLUE CROSS/BLUE SHIELD | Admitting: Family Medicine

## 2017-12-30 ENCOUNTER — Encounter

## 2017-12-30 ENCOUNTER — Ambulatory Visit: Payer: BLUE CROSS/BLUE SHIELD | Admitting: Family Medicine

## 2017-12-30 NOTE — Progress Notes (Deleted)
Subjective:    Patient ID: Reginald Farley, male    DOB: 06/10/1986, 32 y.o.   MRN: [id number]  12/30/2017  No chief complaint on file.    HPI This 32 y.o. male presents for evaluation of ***. BP Readings from Last 3 Encounters:  02/15/17 119/76  12/09/15 122/80  09/02/15 128/82   Wt Readings from Last 3 Encounters:  02/15/17 245 lb (111.1 kg)  12/09/15 222 lb (100.7 kg)  09/02/15 231 lb (104.8 kg)   Immunization History  Administered Date(s) Administered  . Tdap 02/15/2017    Review of Systems  Past Medical History:  Diagnosis Date  . Anxiety   . Depression   . Wolff-Parkinson-White (WPW) syndrome    No past surgical history on file. Allergies  Allergen Reactions  . Sudafed [Pseudoephedrine Hcl] Other (See Comments)     Heart arrythmia   Current Outpatient Medications on File Prior to Visit  Medication Sig Dispense Refill  . clonazePAM (KLONOPIN) 0.5 MG tablet Take 0.5-1 tablets (0.25-0.5 mg total) by mouth 2 (two) times daily as needed for anxiety. 30 tablet 0  . FLUoxetine (PROZAC) 20 MG tablet Take 4 tablets (80 mg total) by mouth daily. Office visit needed for refills 360 tablet 0   No current facility-administered medications on file prior to visit.    Social History   Socioeconomic History  . Marital status: Married    Spouse name: Not on file  . Number of children: Not on file  . Years of education: Not on file  . Highest education level: Not on file  Occupational History  . Not on file  Social Needs  . Financial resource strain: Not on file  . Food insecurity:    Worry: Not on file    Inability: Not on file  . Transportation needs:    Medical: Not on file    Non-medical: Not on file  Tobacco Use  . Smoking status: Never Smoker  . Smokeless tobacco: Never Used  Substance and Sexual Activity  . Alcohol use: Yes    Alcohol/week: 21.0 oz    Types: 35 Cans of beer per week  . Drug use: No  . Sexual activity: Not on file  Lifestyle    . Physical activity:    Days per week: Not on file    Minutes per session: Not on file  . Stress: Not on file  Relationships  . Social connections:    Talks on phone: Not on file    Gets together: Not on file    Attends religious service: Not on file    Active member of club or organization: Not on file    Attends meetings of clubs or organizations: Not on file    Relationship status: Not on file  . Intimate partner violence:    Fear of current or ex partner: Not on file    Emotionally abused: Not on file    Physically abused: Not on file    Forced sexual activity: Not on file  Other Topics Concern  . Not on file  Social History Narrative  . Not on file   Family History  Problem Relation Age of Onset  . Thyroid disease Mother   . Depression Mother   . Depression Father   . Depression Brother   . OCD Brother   . Alzheimer's disease Maternal Grandmother   . Depression Maternal Grandmother   . Heart disease Maternal Grandfather   . Alzheimer's disease Maternal Grandfather  Objective:    There were no vitals taken for this visit. Physical Exam No results found. Depression screen [facility] 2/9 02/15/2017 02/15/2017 12/09/2015 09/02/2015  Decreased Interest 1 0 0 0  Down, Depressed, Hopeless 1 0 1 0  PHQ - 2 Score 2 0 1 0  Altered sleeping 0 - - -  Tired, decreased energy 2 - - -  Change in appetite 2 - - -  Feeling bad or failure about yourself  0 - - -  Trouble concentrating 2 - - -  Moving slowly or fidgety/restless 0 - - -  Suicidal thoughts 0 - - -  PHQ-9 Score 8 - - -   Fall Risk  02/15/2017 02/15/2017  Falls in the past year? No Yes  Number falls in past yr: - 2 or more  Injury with Fall? - No        Assessment & Plan:   1. Mixed obsessional thoughts and acts     ***  No orders of the defined types were placed in this encounter.  No orders of the defined types were placed in this encounter.   No follow-ups on file.   Kristi Paulita Fujita,  M.D. Primary Care at [facility] [address] [place], [place]  [address] [phone] phone [phone] fax

## 2018-01-05 ENCOUNTER — Encounter: Payer: Self-pay | Admitting: Family Medicine

## 2018-01-10 ENCOUNTER — Encounter: Payer: Self-pay | Admitting: Family Medicine

## 2018-01-19 ENCOUNTER — Ambulatory Visit: Payer: BLUE CROSS/BLUE SHIELD | Admitting: Family Medicine

## 2018-04-02 ENCOUNTER — Other Ambulatory Visit: Payer: Self-pay

## 2018-04-02 ENCOUNTER — Encounter: Payer: Self-pay | Admitting: Family Medicine

## 2018-04-02 ENCOUNTER — Ambulatory Visit (INDEPENDENT_AMBULATORY_CARE_PROVIDER_SITE_OTHER): Payer: BLUE CROSS/BLUE SHIELD

## 2018-04-02 ENCOUNTER — Ambulatory Visit (INDEPENDENT_AMBULATORY_CARE_PROVIDER_SITE_OTHER): Payer: BLUE CROSS/BLUE SHIELD | Admitting: Family Medicine

## 2018-04-02 VITALS — BP 120/62 | HR 93 | Temp 99.0°F | Resp 18 | Ht 75.98 in | Wt 231.2 lb

## 2018-04-02 DIAGNOSIS — M795 Residual foreign body in soft tissue: Secondary | ICD-10-CM

## 2018-04-02 DIAGNOSIS — L03115 Cellulitis of right lower limb: Secondary | ICD-10-CM

## 2018-04-02 DIAGNOSIS — M7989 Other specified soft tissue disorders: Secondary | ICD-10-CM

## 2018-04-02 DIAGNOSIS — M79674 Pain in right toe(s): Secondary | ICD-10-CM | POA: Diagnosis not present

## 2018-04-02 DIAGNOSIS — Z23 Encounter for immunization: Secondary | ICD-10-CM

## 2018-04-02 MED ORDER — DOXYCYCLINE HYCLATE 100 MG PO TABS: 100.0000 mg | ORAL_TABLET | Freq: Two times a day (BID) | ORAL | 0 refills | Status: DC

## 2018-04-02 NOTE — Patient Instructions (Addendum)
     IF you received an x-ray today, you will receive an invoice from [facility]. Please contact [facility] at [phone] with questions or concerns regarding your invoice.   IF you received labwork today, you will receive an invoice from [facility]. Please contact [facility] at [phone] with questions or concerns regarding your invoice.   Our billing staff will not be able to assist you with questions regarding bills from these companies.  You will be contacted with the lab results as soon as they are available. The fastest way to get your results is to activate your My Chart account. Instructions are located on the last page of this paperwork. If you have not heard from us regarding the results in 2 weeks, please contact this office.      Cellulitis, Adult Cellulitis is a skin infection. The infected area is usually red and sore. This condition occurs most often in the arms and lower legs. It is very important to get treated for this condition. Follow these instructions at home:  Take over-the-counter and prescription medicines only as told by your doctor.  If you were prescribed an antibiotic medicine, take it as told by your doctor. Do not stop taking the antibiotic even if you start to feel better.  Drink enough fluid to keep your pee (urine) clear or pale yellow.  Do not touch or rub the infected area.  Raise (elevate) the infected area above the level of your heart while you are sitting or lying down.  Place warm or cold wet cloths (warm or cold compresses) on the infected area. Do this as told by your doctor.  Keep all follow-up visits as told by your doctor. This is important. These visits let your doctor make sure your infection is not getting worse. Contact a doctor if:  You have a fever.  Your symptoms do not get better after 1-2 days of treatment.  Your bone or joint under the infected area starts to hurt after the skin has healed.  Your  infection comes back. This can happen in the same area or another area.  You have a swollen bump in the infected area.  You have new symptoms.  You feel ill and also have muscle aches and pains. Get help right away if:  Your symptoms get worse.  You feel very sleepy.  You throw up (vomit) or have watery poop (diarrhea) for a long time.  There are red streaks coming from the infected area.  Your red area gets larger.  Your red area turns darker. This information is not intended to replace advice given to you by your health care provider. Make sure you discuss any questions you have with your health care provider. Document Released: 01/25/2008 Document Revised: 01/14/2016 Document Reviewed: 06/17/2015 Elsevier Interactive Patient Education  2018 Elsevier Inc.  

## 2018-04-02 NOTE — Progress Notes (Signed)
8/12/201912:28 PM  Reginald KansasWilliam R Davern 06/04/1986, 32 y.o. male [id number]  Chief Complaint  Patient presents with  . Foot Injury    x2weeks pulled out big pieces of wood from top of foot and now feels feverish and has chills   . Fever  . Chills    HPI:   Patient is a 32 y.o. male with past medical history significant for OCD who presents today for right foot swelling and pain  2 weeks ago while breaking down a deck he had a large wood shard go thru his work boots and insert itself between his 1st and 2nd toe He removed iit but foot was not healing It was red, swollen, painful His wife 3 days ago removed another piece  There was drainage at that time but since then foot is much better Redness and tenderness significantly improved Still swollen No more drainage Had fevers and chills last night Last tetanus in 2008  Fall Risk  04/02/2018 02/15/2017 02/15/2017  Falls in the past year? Yes No Yes  Number falls in past yr: 2 or more - 2 or more  Injury with Fall? No - No     [facility] 2/9 04/02/2018 02/15/2017 02/15/2017  Decreased Interest 1 1 0  Down, Depressed, Hopeless 1 1 0  PHQ - 2 Score 2 2 0  Altered sleeping 0 0 -  Tired, decreased energy 1 2 -  Change in appetite 0 2 -  Feeling bad or failure about yourself  0 0 -  Trouble concentrating 0 2 -  Moving slowly or fidgety/restless 0 0 -  Suicidal thoughts 0 0 -  PHQ-9 Score 3 8 -    Allergies  Allergen Reactions  . Sudafed [Pseudoephedrine Hcl] Other (See Comments)     Heart arrythmia    Prior to Admission medications   Medication Sig Start Date End Date Taking? Authorizing Provider  clonazePAM (KLONOPIN) 0.5 MG tablet Take 0.5-1 tablets (0.25-0.5 mg total) by mouth 2 (two) times daily as needed for anxiety. 02/15/17  Yes Ethelda ChickSmith, Kristi M, MD  FLUoxetine (PROZAC) 20 MG tablet Take 4 tablets (80 mg total) by mouth daily. Office visit needed for refills 08/21/17  Yes Ethelda ChickSmith, Kristi M, MD    Past  Medical History:  Diagnosis Date  . Anxiety   . Depression   . Wolff-Parkinson-White (WPW) syndrome     History reviewed. No pertinent surgical history.  Social History   Tobacco Use  . Smoking status: Never Smoker  . Smokeless tobacco: Never Used  Substance Use Topics  . Alcohol use: Yes    Alcohol/week: 35.0 standard drinks    Types: 35 Cans of beer per week    Family History  Problem Relation Age of Onset  . Thyroid disease Mother   . Depression Mother   . Depression Father   . Depression Brother   . OCD Brother   . Alzheimer's disease Maternal Grandmother   . Depression Maternal Grandmother   . Heart disease Maternal Grandfather   . Alzheimer's disease Maternal Grandfather     ROS Per hpi  OBJECTIVE:  Blood pressure 120/62, pulse 93, temperature 99 F (37.2 C), temperature source Oral, resp. rate 18, height 6' 3.98" (1.93 m), weight 231 lb 3.2 oz (104.9 kg), SpO2 98 %. Body mass index is 28.16 kg/m.   Physical Exam  Gen: AAOx3, NAD Right foot: puncture wound at base of st and 2nd toes, small scab with underlying induration. No fluctuation. + swelling with  slight erythema. No warmth..     Dg Foot Complete Right  Result Date: 04/02/2018 CLINICAL DATA:  Foreign body. EXAM: RIGHT FOOT COMPLETE - 3+ VIEW COMPARISON:  None. FINDINGS: There is no evidence of fracture or dislocation. There is no evidence of arthropathy or other focal bone abnormality. Soft tissues are unremarkable. No radiopaque foreign body is noted. IMPRESSION: No radiopaque foreign body seen.  Normal right foot. Electronically Signed   By: Lupita RaiderJames  Green Jr, M.D.   On: 04/02/2018 12:47     ASSESSMENT and PLAN  1. Cellulitis of foot, right Discussed supportive measures, new meds r/se/b and RTC precautions. Patient educational handout given. - DG Foot Complete Right; Future - Care order/instruction:  2. Foreign body (FB) in soft tissue - DG Foot Complete Right; Future  3. Pain and swelling  of toe of right foot  4. Need for prophylactic vaccination with tetanus toxoid alone  Other orders - Td vaccine greater than or equal to 7yo preservative free IM - doxycycline (VIBRA-TABS) 100 MG tablet; Take 1 tablet (100 mg total) by mouth 2 (two) times daily.  Return if symptoms worsen or fail to improve.    Myles LippsIrma M Santiago, MD [facility] at [facility] [address] [place], [place] [address] Ph.  [phone] Fax [phone]

## 2018-07-18 ENCOUNTER — Ambulatory Visit: Payer: Self-pay | Admitting: *Deleted

## 2018-07-18 DIAGNOSIS — F422 Mixed obsessional thoughts and acts: Secondary | ICD-10-CM

## 2018-07-18 MED ORDER — FLUOXETINE HCL 20 MG PO TABS: 80.0000 mg | ORAL_TABLET | Freq: Every day | ORAL | 0 refills | Status: DC

## 2018-07-18 NOTE — Telephone Encounter (Signed)
Contacted pt per his request; he states that he took his last dose of fluoxetine 07/18/18;explained to pt that he has to be seen in office for re-evaluation; he verbalized understanding and had previously scheduled appointment 07/25/18 at 1420; explained that refill can be sent to cover him until he is seen in the office; he would like the [facility] [address] [place], [place]. Requested Prescriptions  Pending Prescriptions Disp Refills  . FLUoxetine (PROZAC) 20 MG tablet 28 tablet 0    Sig: Take 4 tablets (80 mg total) by mouth daily. Medication to cover pt until office visit 07/25/18; Office visit needed for refills     There is no refill protocol information for this order

## 2018-07-18 NOTE — Telephone Encounter (Signed)
  Reason for Disposition . Caller requesting a refill, no triage required, and triager able to refill per unit policy  Answer Assessment - Initial Assessment Questions 1. SYMPTOMS: "Do you have any symptoms?"     no 2. SEVERITY: If symptoms are present, ask "Are they mild, moderate or severe?"     no  Protocols used: MEDICATION QUESTION CALL-A-AH

## 2018-07-25 ENCOUNTER — Ambulatory Visit: Payer: BLUE CROSS/BLUE SHIELD | Admitting: Family Medicine

## 2018-07-25 ENCOUNTER — Other Ambulatory Visit: Payer: Self-pay

## 2018-07-25 ENCOUNTER — Encounter: Payer: Self-pay | Admitting: Family Medicine

## 2018-07-25 VITALS — BP 103/64 | HR 78 | Temp 98.2°F | Resp 16 | Ht 76.0 in | Wt 227.0 lb

## 2018-07-25 DIAGNOSIS — Z119 Encounter for screening for infectious and parasitic diseases, unspecified: Secondary | ICD-10-CM | POA: Diagnosis not present

## 2018-07-25 DIAGNOSIS — B882 Other arthropod infestations: Secondary | ICD-10-CM

## 2018-07-25 DIAGNOSIS — Z23 Encounter for immunization: Secondary | ICD-10-CM

## 2018-07-25 DIAGNOSIS — F422 Mixed obsessional thoughts and acts: Secondary | ICD-10-CM

## 2018-07-25 MED ORDER — FLUOXETINE HCL 60 MG PO TABS: 60.0000 mg | ORAL_TABLET | Freq: Every day | ORAL | 3 refills | Status: DC

## 2018-07-25 NOTE — Patient Instructions (Signed)
° ° ° °  If you have lab work done today you will be contacted with your lab results within the next 2 weeks.  If you have not heard from us then please contact us. The fastest way to get your results is to register for My Chart. ° ° °IF you received an x-ray today, you will receive an invoice from [facility]. Please contact [facility] at [phone] with questions or concerns regarding your invoice.  ° °IF you received labwork today, you will receive an invoice from [facility]. Please contact [facility] at [phone] with questions or concerns regarding your invoice.  ° °Our billing staff will not be able to assist you with questions regarding bills from these companies. ° °You will be contacted with the lab results as soon as they are available. The fastest way to get your results is to activate your My Chart account. Instructions are located on the last page of this paperwork. If you have not heard from us regarding the results in 2 weeks, please contact this office. °  ° ° ° °

## 2018-07-25 NOTE — Progress Notes (Signed)
12/4/20192:46 PM  Reginald KansasWilliam R Farley 12/08/1985, 32 y.o. male [id number]  Chief Complaint  Patient presents with  . Medication Refill    need refill on fluoxetine HCL 20 mg, also would like to have blood work done for the zica virus . Would like to talk to you about the flu shot before getting the flu shot.    HPI:   Patient is a 32 y.o. male with past medical history significant for anxiety and depression who presents today for medication refill  Doing really well on fluoxetine Requesting refill Has been doing 60mg  a day  Was in [place] 2 weeks recently, dry season He will also have to travel soon to [place] He denies any recent illness Wife in 5 months pregnant wondering about zika  Fall Risk  07/25/2018 04/02/2018 02/15/2017 02/15/2017  Falls in the past year? 0 Yes No Yes  Number falls in past yr: - 2 or more - 2 or more  Injury with Fall? - No - No     [facility] 2/9 07/25/2018 04/02/2018 02/15/2017  Decreased Interest 0 1 1  Down, Depressed, Hopeless 0 1 1  PHQ - 2 Score 0 2 2  Altered sleeping - 0 0  Tired, decreased energy - 1 2  Change in appetite - 0 2  Feeling bad or failure about yourself  - 0 0  Trouble concentrating - 0 2  Moving slowly or fidgety/restless - 0 0  Suicidal thoughts - 0 0  PHQ-9 Score - 3 8    Allergies  Allergen Reactions  . Sudafed [Pseudoephedrine Hcl] Other (See Comments)     Heart arrythmia    Prior to Admission medications   Medication Sig Start Date End Date Taking? Authorizing Provider  FLUoxetine (PROZAC) 20 MG tablet Take 4 tablets (80 mg total) by mouth daily. Medication to cover pt until office visit 07/25/18; Office visit needed for refills 07/18/18  Yes Myles LippsSantiago, Karol Liendo M, MD  doxycycline (VIBRA-TABS) 100 MG tablet Take 1 tablet (100 mg total) by mouth 2 (two) times daily. 04/02/18   Myles LippsSantiago, Lliam Hoh M, MD    Past Medical History:  Diagnosis Date  . Anxiety   . Depression   . Wolff-Parkinson-White (WPW)  syndrome     No past surgical history on file.  Social History   Tobacco Use  . Smoking status: Never Smoker  . Smokeless tobacco: Never Used  Substance Use Topics  . Alcohol use: Yes    Alcohol/week: 35.0 standard drinks    Types: 35 Cans of beer per week    Family History  Problem Relation Age of Onset  . Thyroid disease Mother   . Depression Mother   . Depression Father   . Depression Brother   . OCD Brother   . Alzheimer's disease Maternal Grandmother   . Depression Maternal Grandmother   . Heart disease Maternal Grandfather   . Alzheimer's disease Maternal Grandfather     ROS Per hpi  OBJECTIVE:  Blood pressure 103/64, pulse 78, temperature 98.2 F (36.8 C), temperature source Oral, resp. rate 16, height 6\' 4"  (1.93 m), weight 227 lb (103 kg), SpO2 96 %. Body mass index is 27.63 kg/m.   Physical Exam  Constitutional: He is oriented to person, place, and time. He appears well-developed and well-nourished.  HENT:  Head: Normocephalic and atraumatic.  Mouth/Throat: Oropharynx is clear and moist.  Eyes: Pupils are equal, round, and reactive to light. Conjunctivae and EOM are normal.  Neck: Neck supple.  Pulmonary/Chest: Effort normal.  Neurological: He is alert and oriented to person, place, and time.  Skin: Skin is warm and dry.  Psychiatric: He has a normal mood and affect.  Nursing note and vitals reviewed.    ASSESSMENT and PLAN  1. Mixed obsessional thoughts and acts - FLUoxetine 60 MG TABS; Take 60 mg by mouth daily.  2. Screening examination for infectious disease 3. Mosquito-borne disease Patient did not go to area with current reported outbreak per CDC map, however there has been cases in the past. Wife past first trimester which is time of highest risk. Per CDC, if wife were to be positive there is a 5% risk of complications from zika. We discussed condom use. He will ask insurance if test covered and what cost would be out of pocket. Then he  will decide if to test. Option for testing only wife or testing after upcoming trip are also viable.  - Zika Virus NAA Comprehensive; Future  4. Need for prophylactic vaccination and inoculation against influenza - Flu Vaccine QUAD 36+ mos IM; Future  Return in about 6 months (around 01/24/2019).    Myles Lipps, MD Primary Care at [facility] [address] [place], [place] [address] Ph.  [phone] Fax [phone]

## 2019-05-21 ENCOUNTER — Other Ambulatory Visit: Payer: Self-pay | Admitting: Family Medicine

## 2019-05-21 DIAGNOSIS — F422 Mixed obsessional thoughts and acts: Secondary | ICD-10-CM

## 2019-05-21 NOTE — Telephone Encounter (Signed)
Requested medication (s) are due for refill today: yes  Requested medication (s) are on the active medication list: yes  Last refill:  07/25/2018  Future visit scheduled: no  Notes to clinic:  Review for refill   Requested Prescriptions  Pending Prescriptions Disp Refills   FLUoxetine (PROZAC) 20 MG tablet [Pharmacy Med Name: FLUoxetine HCl 20 MG Oral Tablet] 28 tablet 0    Sig: TAKE 4 TABLETS BY MOUTH ONCE DAILY     Psychiatry:  Antidepressants - SSRI Failed - 05/21/2019  1:51 PM      Failed - Valid encounter within last 6 months    Recent Outpatient Visits          10 months ago Mixed obsessional thoughts and acts   [facility], Lilia Argue, MD   1 year ago Cellulitis of foot, right   [facility] at [facility], Lilia Argue, MD   2 years ago Mixed obsessional thoughts and acts   Primary Care at [facility], Renette Butters, MD   3 years ago OCD (obsessive compulsive disorder)   Primary Care at [facility], Renette Butters, MD   3 years ago Abnormal CXR   [facility] at [facility], Synetta Shadow, MD             Failed - Completed PHQ-2 or PHQ-9 in the last 360 days.

## 2019-07-25 ENCOUNTER — Other Ambulatory Visit: Payer: Self-pay | Admitting: Family Medicine

## 2019-07-25 DIAGNOSIS — F422 Mixed obsessional thoughts and acts: Secondary | ICD-10-CM

## 2019-07-25 NOTE — Telephone Encounter (Signed)
Medication Refill - Medication: FLUoxetine 60 MG TABS  Pt states he is visiting [place] and has lost/forgotten his medication and would like a refill called in to a local pharmacy. Please advise.   Preferred Pharmacy:  [id number] [place], [place] [phone] (Phone) [phone] (Fax)   Pt advised to f/up with local pharmacy.

## 2019-07-25 NOTE — Telephone Encounter (Signed)
Requested medication (s) are due for refill today: yes  Requested medication (s) are on the active medication list: yes  Last refill:  07/25/2018  Future visit scheduled: no  Notes to clinic: Pt states he is visiting [place] and has lost/forgotten his medication and would like a refill called in to a local pharmacy.    Requested Prescriptions  Pending Prescriptions Disp Refills   FLUoxetine HCl 60 MG TABS 90 tablet 3    Sig: Take 60 mg by mouth daily.     Psychiatry:  Antidepressants - SSRI Failed - 07/25/2019  8:22 AM      Failed - Valid encounter within last 6 months    Recent Outpatient Visits          1 year ago Mixed obsessional thoughts and acts   [facility] at [facility], Lilia Argue, MD   1 year ago Cellulitis of foot, right   [facility] at [facility], Lilia Argue, MD   2 years ago Mixed obsessional thoughts and acts   Primary Care at [facility], Renette Butters, MD   3 years ago OCD (obsessive compulsive disorder)   Primary Care at [facility], Renette Butters, MD   3 years ago Abnormal CXR   Primary [facility] at [facility], MD             Failed - Completed PHQ-2 or PHQ-9 in the last 360 days.

## 2019-07-26 NOTE — Telephone Encounter (Signed)
PT CALLING BACK REQUESTING REFILL FR

## 2019-07-31 NOTE — Telephone Encounter (Signed)
I have attempted to call pt and there was no answer. Left message to call back and schedule an appointment.   Thanks

## 2019-08-02 MED ORDER — FLUOXETINE HCL 60 MG PO TABS: 60.0000 mg | ORAL_TABLET | Freq: Every day | ORAL | 0 refills | Status: AC

## 2019-08-02 NOTE — Telephone Encounter (Signed)
Please advise if med refill is appropriate.  Pt was last seen  07/25/18 with no f/u visit scheduled. Please advise on refill.   It does look like pt does need to be seen soon.

## 2020-01-22 IMAGING — DX DG FOOT COMPLETE 3+V*R*
3 series · 3 of 3 positions shown · non-contrast
Comparison: None.

CLINICAL DATA: Foreign body.

EXAM:
RIGHT FOOT COMPLETE - 3+ VIEW

[foot ap]
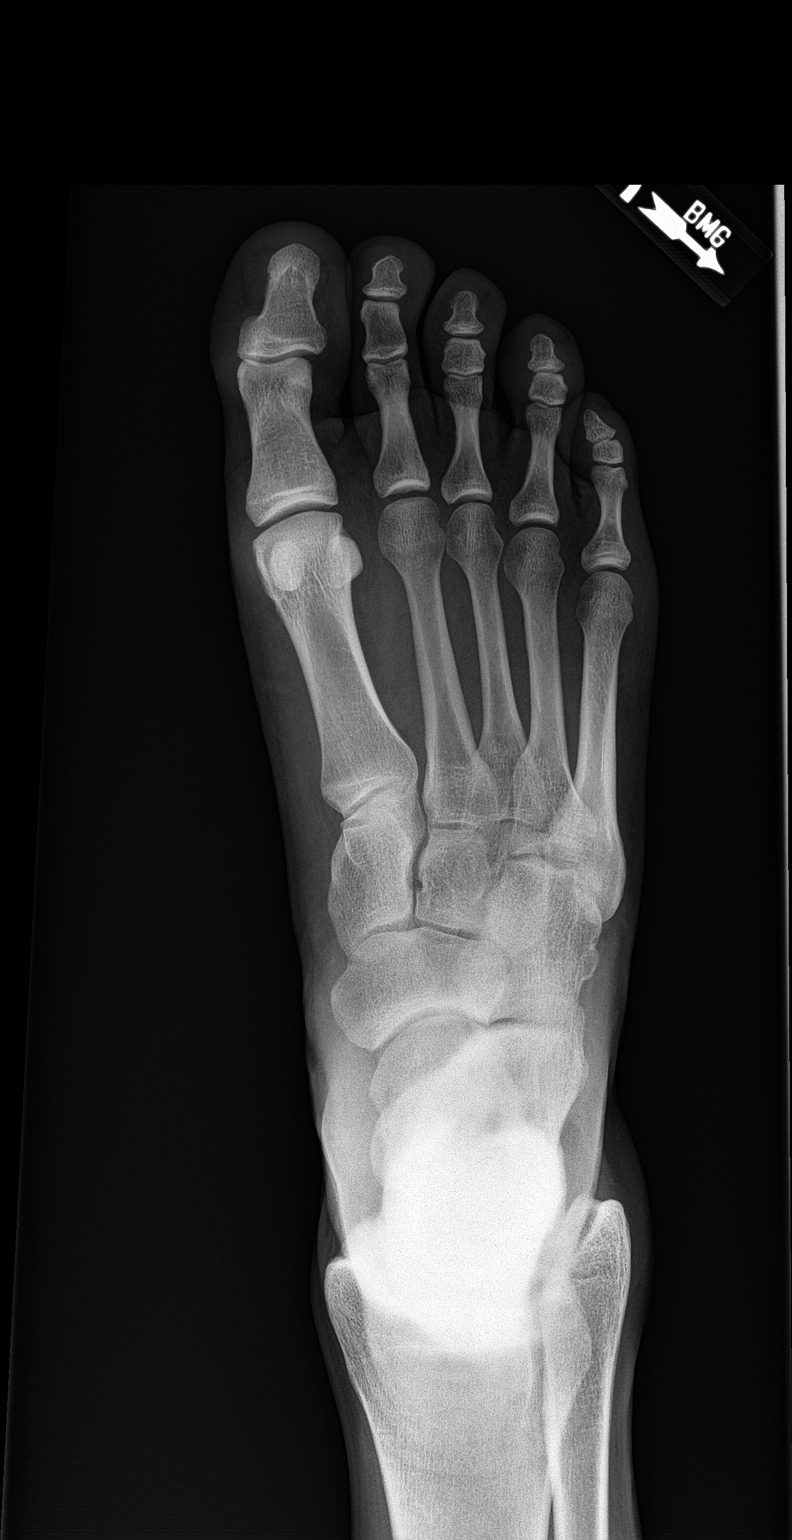

[foot obl]
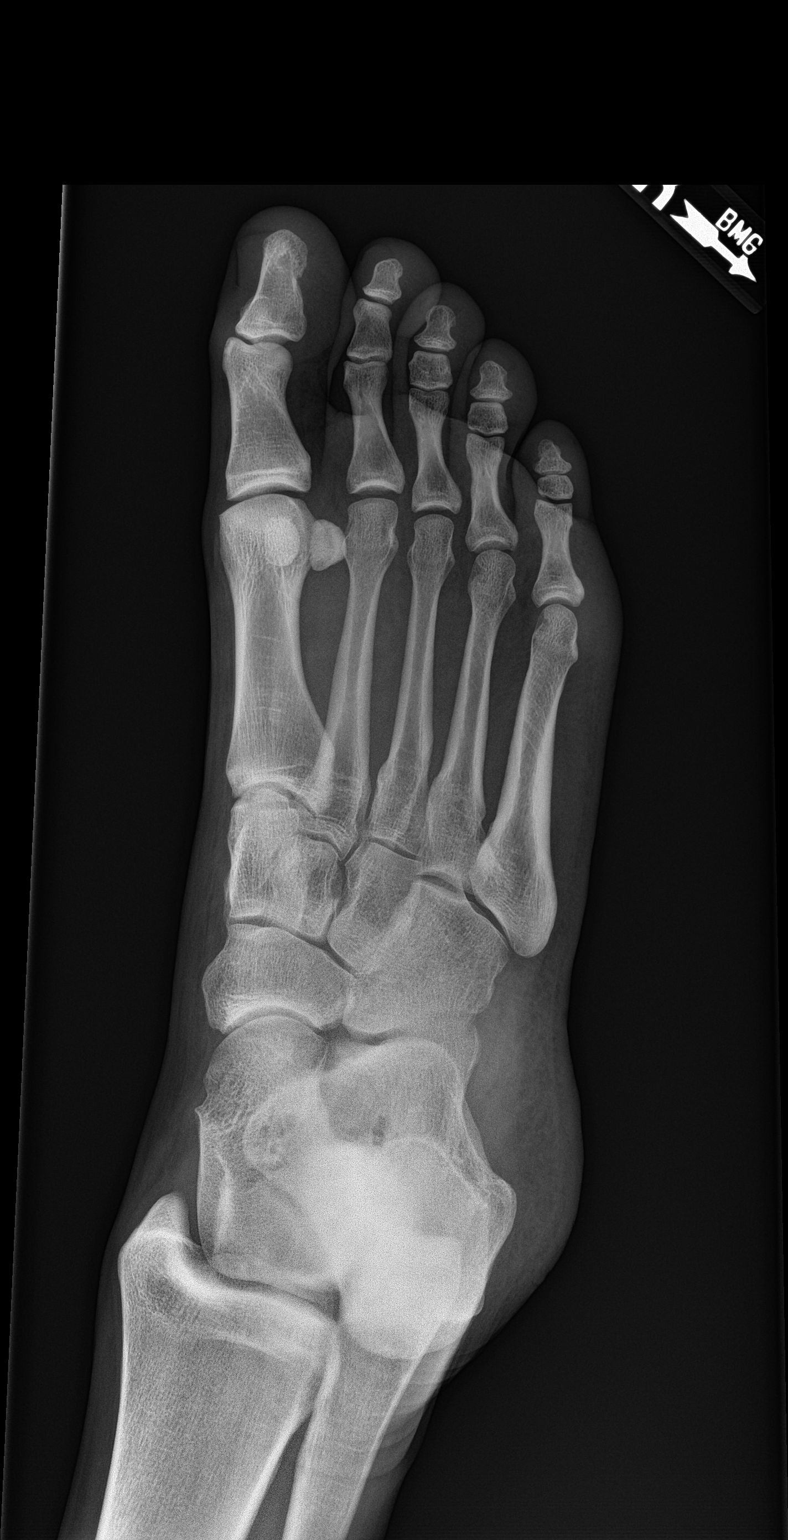

[foot lat]
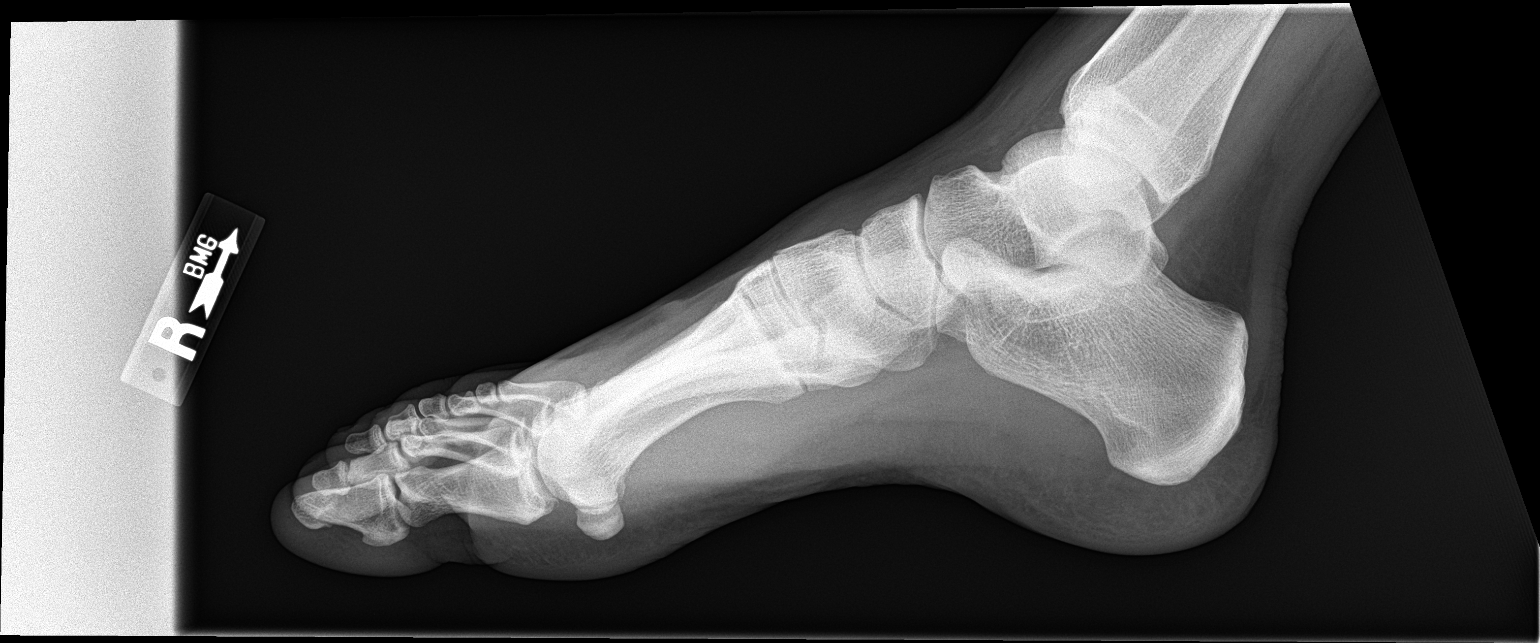

[3 of 3 positions shown; findings below may reference images not displayed]

FINDINGS: There is no evidence of fracture or dislocation. There is no
evidence of arthropathy or other focal bone abnormality. Soft
tissues are unremarkable. No radiopaque foreign body is noted.
IMPRESSION: No radiopaque foreign body seen.  Normal right foot.
